# Patient Record
Sex: Female | Born: 1970 | Race: White | Hispanic: No | Marital: Single | Smoking: Current every day smoker
Health system: Southern US, Community
[De-identification: ages and names within clinical notes are randomized; demographics above are authoritative.]

## PROBLEM LIST (undated history)

## (undated) DIAGNOSIS — G8929 Other chronic pain: Secondary | ICD-10-CM

## (undated) DIAGNOSIS — F32A Depression, unspecified: Secondary | ICD-10-CM

## (undated) DIAGNOSIS — B1920 Unspecified viral hepatitis C without hepatic coma: Secondary | ICD-10-CM

## (undated) DIAGNOSIS — I639 Cerebral infarction, unspecified: Secondary | ICD-10-CM

## (undated) DIAGNOSIS — F329 Major depressive disorder, single episode, unspecified: Secondary | ICD-10-CM

## (undated) DIAGNOSIS — C801 Malignant (primary) neoplasm, unspecified: Secondary | ICD-10-CM

## (undated) DIAGNOSIS — F419 Anxiety disorder, unspecified: Secondary | ICD-10-CM

## (undated) DIAGNOSIS — J45909 Unspecified asthma, uncomplicated: Secondary | ICD-10-CM

## (undated) HISTORY — DX: Unspecified viral hepatitis C without hepatic coma: B19.20

## (undated) HISTORY — PX: FOOT SURGERY: SHX648

## (undated) HISTORY — PX: TUBAL LIGATION: SHX77

## (undated) HISTORY — DX: Unspecified asthma, uncomplicated: J45.909

## (undated) HISTORY — PX: KNEE SURGERY: SHX244

## (undated) HISTORY — PX: OVARY SURGERY: SHX727

## (undated) HISTORY — PX: CHOLECYSTECTOMY: SHX55

---

## 2003-03-27 ENCOUNTER — Ambulatory Visit (HOSPITAL_COMMUNITY): Admission: RE | Admit: 2003-03-27 | Discharge: 2003-03-27 | Payer: Self-pay | Admitting: Family Medicine

## 2003-11-07 ENCOUNTER — Ambulatory Visit (HOSPITAL_COMMUNITY): Admission: RE | Admit: 2003-11-07 | Discharge: 2003-11-07 | Payer: Self-pay | Admitting: Family Medicine

## 2003-11-22 ENCOUNTER — Ambulatory Visit (HOSPITAL_COMMUNITY): Admission: RE | Admit: 2003-11-22 | Discharge: 2003-11-22 | Payer: Self-pay | Admitting: Family Medicine

## 2003-11-23 ENCOUNTER — Ambulatory Visit (HOSPITAL_COMMUNITY): Admission: RE | Admit: 2003-11-23 | Discharge: 2003-11-23 | Payer: Self-pay | Admitting: Family Medicine

## 2003-12-03 ENCOUNTER — Ambulatory Visit (HOSPITAL_COMMUNITY): Admission: RE | Admit: 2003-12-03 | Discharge: 2003-12-03 | Payer: Self-pay | Admitting: Family Medicine

## 2003-12-20 ENCOUNTER — Encounter (HOSPITAL_COMMUNITY): Admission: RE | Admit: 2003-12-20 | Discharge: 2003-12-23 | Payer: Self-pay | Admitting: Family Medicine

## 2004-01-09 ENCOUNTER — Ambulatory Visit (HOSPITAL_COMMUNITY): Admission: RE | Admit: 2004-01-09 | Discharge: 2004-01-09 | Payer: Self-pay | Admitting: General Surgery

## 2004-01-14 ENCOUNTER — Observation Stay (HOSPITAL_COMMUNITY): Admission: RE | Admit: 2004-01-14 | Discharge: 2004-01-15 | Payer: Self-pay | Admitting: General Surgery

## 2004-02-07 ENCOUNTER — Emergency Department (HOSPITAL_COMMUNITY): Admission: EM | Admit: 2004-02-07 | Discharge: 2004-02-07 | Payer: Self-pay | Admitting: Emergency Medicine

## 2005-02-08 HISTORY — PX: CHOLECYSTECTOMY: SHX55

## 2005-05-29 IMAGING — NM NM HEPATO W/GB/PHARM/[PERSON_NAME]
2 series · 12 of 12 positions shown · non-contrast
Comparison: none

CLINICAL DATA: Right upper quadrant pain.
 NUCLEAR MEDICINE HEPATOBILIARY SCAN
 Hepatobiliary study was performed after the IV administration of 5.0 mCi 8c-AAK Choletec.  Images were performed at different intervals up to one hour.

[Series 1: hepatobiliary · 3.20mm/px · 6 of 60 frames shown (1 of 2)]
[frame 6/60]
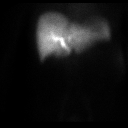
[frame 16/60]
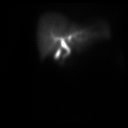
[frame 26/60]
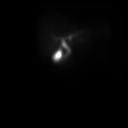
[frame 36/60]
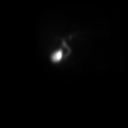
[frame 46/60]
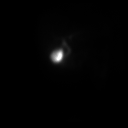
[frame 56/60]
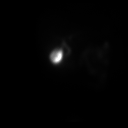

[Series 1: hepatobiliary · 3.20mm/px · 6 of 60 frames shown (2 of 2)]
[frame 6/60]
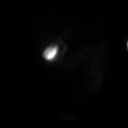
[frame 16/60]
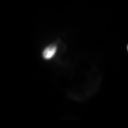
[frame 26/60]
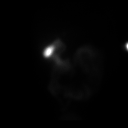
[frame 36/60]
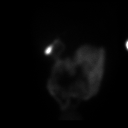
[frame 46/60]
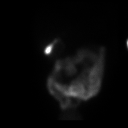
[frame 56/60]
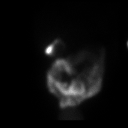

[12 of 12 positions shown; findings below may reference images not displayed]

The liver was seen promptly followed by the biliary ducts and then the gallbladder in the first 12 minutes of the study.  Intestinal activity was seen approximately by 17 minutes.  At the end of one hour, most of the activity had cleared from the liver and was concentrated in the gallbladder and small intestine.  Post fatty meal challenge, there was more passage of the tracer into the small bowel and the gallbladder emptied with an ejection fraction of 82% (normal greater than 35%).
IMPRESSION: Normal gallbladder ejection fraction of 82% with no evidence of cystic duct or common bile duct obstruction.

## 2005-06-01 ENCOUNTER — Ambulatory Visit: Payer: Self-pay | Admitting: Physical Medicine and Rehabilitation

## 2005-06-01 ENCOUNTER — Encounter
Admission: RE | Admit: 2005-06-01 | Discharge: 2005-08-30 | Payer: Self-pay | Admitting: Physical Medicine and Rehabilitation

## 2005-06-18 ENCOUNTER — Ambulatory Visit (HOSPITAL_COMMUNITY)
Admission: RE | Admit: 2005-06-18 | Discharge: 2005-06-18 | Payer: Self-pay | Admitting: Physical Medicine and Rehabilitation

## 2005-07-15 ENCOUNTER — Ambulatory Visit: Payer: Self-pay | Admitting: Physical Medicine and Rehabilitation

## 2006-06-10 ENCOUNTER — Ambulatory Visit (HOSPITAL_COMMUNITY): Admission: RE | Admit: 2006-06-10 | Discharge: 2006-06-10 | Payer: Self-pay | Admitting: Family Medicine

## 2006-11-23 ENCOUNTER — Ambulatory Visit (HOSPITAL_COMMUNITY): Admission: RE | Admit: 2006-11-23 | Discharge: 2006-11-23 | Payer: Self-pay | Admitting: Family Medicine

## 2007-05-31 ENCOUNTER — Emergency Department (HOSPITAL_COMMUNITY): Admission: EM | Admit: 2007-05-31 | Discharge: 2007-05-31 | Payer: Self-pay | Admitting: Emergency Medicine

## 2009-07-06 ENCOUNTER — Emergency Department (HOSPITAL_COMMUNITY): Admission: EM | Admit: 2009-07-06 | Discharge: 2009-07-06 | Payer: Self-pay | Admitting: Emergency Medicine

## 2010-03-01 ENCOUNTER — Encounter: Payer: Self-pay | Admitting: Physical Medicine and Rehabilitation

## 2010-03-01 ENCOUNTER — Encounter: Payer: Self-pay | Admitting: Family Medicine

## 2010-04-02 ENCOUNTER — Emergency Department (HOSPITAL_COMMUNITY)
Admission: EM | Admit: 2010-04-02 | Discharge: 2010-04-02 | Disposition: A | Discharge status: Home / Self Care | Payer: Medicare Other | Attending: Emergency Medicine | Admitting: Emergency Medicine

## 2010-04-02 DIAGNOSIS — M545 Low back pain, unspecified: Secondary | ICD-10-CM | POA: Insufficient documentation

## 2010-04-02 DIAGNOSIS — Z760 Encounter for issue of repeat prescription: Secondary | ICD-10-CM | POA: Insufficient documentation

## 2010-04-27 LAB — POCT PREGNANCY, URINE: Preg Test, Ur: NEGATIVE

## 2010-04-27 LAB — DIFFERENTIAL
Basophils Absolute: 0.2 10*3/uL — ABNORMAL HIGH (ref 0.0–0.1)
Basophils Relative: 2 % — ABNORMAL HIGH (ref 0–1)
Eosinophils Absolute: 0.1 10*3/uL (ref 0.0–0.7)
Eosinophils Relative: 1 % (ref 0–5)
Lymphocytes Relative: 26 % (ref 12–46)
Lymphs Abs: 3.5 10*3/uL (ref 0.7–4.0)
Monocytes Absolute: 0.8 10*3/uL (ref 0.1–1.0)
Monocytes Relative: 6 % (ref 3–12)
Neutro Abs: 8.8 10*3/uL — ABNORMAL HIGH (ref 1.7–7.7)
Neutrophils Relative %: 66 % (ref 43–77)

## 2010-04-27 LAB — POCT I-STAT, CHEM 8
BUN: 5 mg/dL — ABNORMAL LOW (ref 6–23)
Calcium, Ion: 1.15 mmol/L (ref 1.12–1.32)
Chloride: 105 mEq/L (ref 96–112)
Creatinine, Ser: 0.8 mg/dL (ref 0.4–1.2)
Glucose, Bld: 77 mg/dL (ref 70–99)
HCT: 45 % (ref 36.0–46.0)
Hemoglobin: 15.3 g/dL — ABNORMAL HIGH (ref 12.0–15.0)
Potassium: 3.7 mEq/L (ref 3.5–5.1)
Sodium: 139 mEq/L (ref 135–145)
TCO2: 26 mmol/L (ref 0–100)

## 2010-04-27 LAB — URINALYSIS, ROUTINE W REFLEX MICROSCOPIC
Bilirubin Urine: NEGATIVE
Glucose, UA: NEGATIVE mg/dL
Hgb urine dipstick: NEGATIVE
Ketones, ur: NEGATIVE mg/dL
Nitrite: NEGATIVE
Protein, ur: NEGATIVE mg/dL
Specific Gravity, Urine: 1.025 (ref 1.005–1.030)
Urobilinogen, UA: 0.2 mg/dL (ref 0.0–1.0)
pH: 5.5 (ref 5.0–8.0)

## 2010-04-27 LAB — CBC
HCT: 43.4 % (ref 36.0–46.0)
Hemoglobin: 14.9 g/dL (ref 12.0–15.0)
MCHC: 34.3 g/dL (ref 30.0–36.0)
MCV: 91.3 fL (ref 78.0–100.0)
Platelets: 213 10*3/uL (ref 150–400)
RBC: 4.75 MIL/uL (ref 3.87–5.11)
RDW: 13.2 % (ref 11.5–15.5)
WBC: 13.4 10*3/uL — ABNORMAL HIGH (ref 4.0–10.5)

## 2010-04-27 LAB — RPR: RPR Ser Ql: NONREACTIVE

## 2010-06-17 ENCOUNTER — Emergency Department (HOSPITAL_COMMUNITY)
Admission: EM | Admit: 2010-06-17 | Discharge: 2010-06-17 | Disposition: A | Discharge status: Home / Self Care | Payer: Medicare Other | Attending: Emergency Medicine | Admitting: Emergency Medicine

## 2010-06-17 DIAGNOSIS — Z760 Encounter for issue of repeat prescription: Secondary | ICD-10-CM | POA: Insufficient documentation

## 2010-06-17 DIAGNOSIS — F411 Generalized anxiety disorder: Secondary | ICD-10-CM | POA: Insufficient documentation

## 2010-06-17 DIAGNOSIS — F329 Major depressive disorder, single episode, unspecified: Secondary | ICD-10-CM | POA: Insufficient documentation

## 2010-06-17 DIAGNOSIS — Z79899 Other long term (current) drug therapy: Secondary | ICD-10-CM | POA: Insufficient documentation

## 2010-06-17 DIAGNOSIS — F3289 Other specified depressive episodes: Secondary | ICD-10-CM | POA: Insufficient documentation

## 2010-06-26 NOTE — Op Note (Signed)
Brooke Woods, [name]               ACCOUNT NO.:  [id number]   MEDICAL RECORD NO.:  [id number]          PATIENT TYPE:  OBV   LOCATION:  A306                          FACILITY:  [facility]   PHYSICIAN:  Dirk Dress. Katrinka Blazing, M.D.   DATE OF BIRTH:  [birth year]   DATE OF PROCEDURE:  01/14/2004  DATE OF DISCHARGE:                                 OPERATIVE REPORT   PREOPERATIVE DIAGNOSIS:  Chronic cholecystitis.   POSTOPERATIVE DIAGNOSIS:  Chronic cholecystitis.   PROCEDURE:  Laparoscopic cholecystectomy.   SURGEON:  Dr. Katrinka Blazing.   DESCRIPTION:  Under general endotracheal anesthesia, the patient's abdomen  was prepped and draped in sterile field. Supraumbilical incision was made.  Veress needle was inserted uneventfully. Abdomen was insufflated with 2.5  liters of CO2. Using a Visiport guide, a 10-mm port was placed. Laparoscope  was placed. Very distended and slightly thickened gallbladder was  visualized. Under videoscopic guidance, a 10-mm port and two 5-mm ports were  placed. The gallbladder was grasped and positioned. Cystic duct was  dissected. It was dissected close to the gallbladder. It was clipped with 5  clips and divided, leaving a long cystic duct stump. The cystic artery with  only 1 major branch noted. It was clipped 3 clips and divided. Using  electrocautery, gallbladder was separated from the intrahepatic space  without difficulty. The gallbladder was placed in an EndoCatch device and  retrieved intact. There was no bleeding from the bed. Irrigation above and  below the liver revealed no bleeding  and no bile leak. CO2 was allowed to  escape from the abdomen, and the ports were removed. The incisions were  closed using 0 Vicryl on the fascia of the supraumbilical incision and  staples on the skin incisions. The patient tolerated the procedure well. She  was awakened from anesthesia uneventfully, transferred to a bed, and taken  to the post anesthetic care unit for further  monitoring.     Lero   LCS/MEDQ  D:  01/14/2004  T:  01/14/2004  Job:  [id number]

## 2010-06-26 NOTE — H&P (Signed)
Brooke Woods, Brooke Woods               ACCOUNT NO.:  [id number]   MEDICAL RECORD NO.:  [id number]          PATIENT TYPE:  AMB   LOCATION:  DAY                           FACILITY:  [facility]   PHYSICIAN:  Jerolyn Shin C. Katrinka Blazing, M.D.   DATE OF BIRTH:  1970/06/23   DATE OF ADMISSION:  DATE OF DISCHARGE:  LH                                HISTORY & PHYSICAL   HISTORY OF PRESENT ILLNESS:  A 40 year old female with a history of  recurrent upper abdominal discomfort without nausea, with postprandial  bloating and enlarging abdomen.  She has not had postprandial diarrhea.  She  has spicy and greasy and fried food intolerance.  Upper abdominal ultrasound  was unremarkable, except for gallbladder sludge.  The HIDA scan is normal.   PAST MEDICAL HISTORY:  1.  Depression with anxiety.  2.  Chronic low back pain.  3.  Chronic bilateral foot pain.   MEDICATIONS:  1.  Prozac 20 mg b.i.d.  2.  Hydrochlorothiazide 12.5 mg daily.  3.  Xanax 1 mg t.i.d.  4.  Neurontin 300 mg b.i.d.  5.  Wellbutrin XL 150 mg daily.  6.  Percocet 10/325 one q.i.d.   PHYSICAL EXAMINATION:  VITAL SIGNS:  Blood pressure 110/70, pulse 76,  respirations 18, weight 137 pounds.  HEENT:  Unremarkable.  NECK:  Supple.  No JVD, bruit, adenopathy, or thyromegaly.  CHEST:  Clear to auscultation.  HEART:  Regular rate and rhythm without murmurs, gallops, or rubs.  ABDOMEN:  Epigastric and right upper quadrant tenderness.  Mild hypogastric  tenderness.  Normoactive bowel sounds.  No masses.  BACK:  Diffuse lower lumbar tenderness.  EXTREMITIES:  There is tenderness with mild swelling of the left knee.  NEUROLOGIC:  No motor, sensory, or cerebellar deficit.   IMPRESSION:  1.  Recurrent epigastric pain of uncertain etiology.  2.  Chronic low back pain.  3.  Chronic bilateral foot pain.  4.  Osteoarthritis.  5.  Depression with anxiety.   PLAN:  Upper endoscopy.  If upper endoscopy is entirely normal, it is felt  that she should have  cholecystectomy for chronic cholecystitis or biliary  dyskinesia.     Lero   LCS/MEDQ  D:  01/08/2004  T:  01/09/2004  Job:  [id number]
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
## 2010-06-28 ENCOUNTER — Emergency Department (HOSPITAL_COMMUNITY)
Admission: EM | Admit: 2010-06-28 | Discharge: 2010-06-28 | Disposition: A | Discharge status: Home / Self Care | Payer: Medicare Other | Attending: Emergency Medicine | Admitting: Emergency Medicine

## 2010-06-28 DIAGNOSIS — F411 Generalized anxiety disorder: Secondary | ICD-10-CM | POA: Insufficient documentation

## 2010-06-28 DIAGNOSIS — F329 Major depressive disorder, single episode, unspecified: Secondary | ICD-10-CM | POA: Insufficient documentation

## 2010-06-28 DIAGNOSIS — Z760 Encounter for issue of repeat prescription: Secondary | ICD-10-CM | POA: Insufficient documentation

## 2010-06-28 DIAGNOSIS — G8929 Other chronic pain: Secondary | ICD-10-CM | POA: Insufficient documentation

## 2010-06-28 DIAGNOSIS — F3289 Other specified depressive episodes: Secondary | ICD-10-CM | POA: Insufficient documentation

## 2010-06-28 DIAGNOSIS — Z79899 Other long term (current) drug therapy: Secondary | ICD-10-CM | POA: Insufficient documentation

## 2010-08-13 ENCOUNTER — Other Ambulatory Visit (HOSPITAL_COMMUNITY): Payer: Medicare Other

## 2010-08-14 ENCOUNTER — Telehealth (HOSPITAL_COMMUNITY): Payer: Self-pay | Admitting: Neurology

## 2010-08-14 NOTE — Telephone Encounter (Signed)
Pt. Was a NOS 08/13/2010 call to [facility] voice mail picked up unable to leave message

## 2010-08-17 ENCOUNTER — Emergency Department (HOSPITAL_COMMUNITY)
Admission: EM | Admit: 2010-08-17 | Discharge: 2010-08-17 | Disposition: A | Discharge status: Home / Self Care | Payer: Medicare Other | Attending: Emergency Medicine | Admitting: Emergency Medicine

## 2010-08-17 ENCOUNTER — Other Ambulatory Visit: Payer: Self-pay

## 2010-08-17 DIAGNOSIS — G8929 Other chronic pain: Secondary | ICD-10-CM | POA: Insufficient documentation

## 2010-08-17 DIAGNOSIS — F17200 Nicotine dependence, unspecified, uncomplicated: Secondary | ICD-10-CM | POA: Insufficient documentation

## 2010-08-17 DIAGNOSIS — F411 Generalized anxiety disorder: Secondary | ICD-10-CM | POA: Diagnosis present

## 2010-08-17 DIAGNOSIS — Z8543 Personal history of malignant neoplasm of ovary: Secondary | ICD-10-CM | POA: Insufficient documentation

## 2010-08-17 HISTORY — DX: Other chronic pain: G89.29

## 2010-08-17 HISTORY — DX: Anxiety disorder, unspecified: F41.9

## 2010-08-17 HISTORY — DX: Malignant (primary) neoplasm, unspecified: C80.1

## 2010-08-17 HISTORY — DX: Cerebral infarction, unspecified: I63.9

## 2010-08-17 HISTORY — DX: Major depressive disorder, single episode, unspecified: F32.9

## 2010-08-17 HISTORY — DX: Depression, unspecified: F32.A

## 2010-08-17 MED ORDER — LORAZEPAM 1 MG PO TABS: 1.0000 mg | ORAL_TABLET | Freq: Two times a day (BID) | ORAL | Status: AC

## 2010-08-17 NOTE — ED Notes (Signed)
Pt presents with c/o of being out of medication. Pt states she cant sleep, she feel nervous, sweaty hands, and chest tightness. Pt has been out of medications x 3 days. Pt is out of Cymbalta, Xanax, Ambien, and Oxycodone.

## 2010-08-17 NOTE — ED Notes (Signed)
Patient reports having an appointment for [facility] on August 22.

## 2010-08-17 NOTE — ED Provider Notes (Signed)
History     Chief Complaint  Patient presents with  . Medication Refill   HPI Comments: Pt reports she has been out of her usual meds for 3 days (xanx, cymbalta, ambien, and percocet) and is feeling very anxious and having panic attacks. +chest tightness with panic attacks but no cp/sob. No sx currently. Also c/o usual chronic pain that is unchanged from baseline. Pt is requesting referral to pain management clinic. Denies n/v/d, f/c, abd pain, or ha.  Patient is a 40 y.o. female presenting with anxiety.  Anxiety This is a chronic problem. The current episode started 2 days ago. Episode frequency: intermittently. The problem has been gradually worsening. Pertinent negatives include no chest pain, no abdominal pain, no headaches and no shortness of breath. The symptoms are aggravated by nothing. The symptoms are relieved by medications.    Past Medical History  Diagnosis Date  . Depression   . Anxiety   . Chronic pain   . Cancer     Pt states she has had ovarian cancer  . Stroke     Pt reported TIA 04/27/10    Past Surgical History  Procedure Date  . Ovary surgery   . Foot surgery   . Knee surgery   . Cholecystectomy     History reviewed. No pertinent family history.  History  Substance Use Topics  . Smoking status: Current Everyday Smoker -- 1.0 packs/day  . Smokeless tobacco: Not on file  . Alcohol Use: No    OB History    Grav Para Term Preterm Abortions TAB SAB Ect Mult Living                  Review of Systems  Respiratory: Negative for shortness of breath.   Cardiovascular: Negative for chest pain.  Gastrointestinal: Negative for abdominal pain.  Neurological: Negative for headaches.  Psychiatric/Behavioral: The patient is nervous/anxious.   All other systems reviewed and are negative.    Physical Exam  BP 126/83  Pulse 76  Temp(Src) 98.3 F (36.8 C) (Oral)  Resp 20  Ht 5\' 7"  (1.702 m)  Wt 110 lb (49.896 kg)  BMI 17.23 kg/m2  SpO2 97%  LMP  08/10/2010  Physical Exam CONSTITUTIONAL: Well developed/well nourished; slightly anxious HEAD AND FACE: Normocephalic/atraumatic EYES: EOMI/PERRL ENMT: Mucous membranes moist NECK: supple no meningeal signs CV: S1/S2 noted, no murmurs/rubs/gallops noted LUNGS: Lungs are clear to auscultation bilaterally, no apparent distress ABDOMEN: soft, nontender, no rebound or guarding GU:no cva tenderness NEURO: Pt is awake/alert, moves all extremitiesx4 EXTREMITIES: pulses normal, full ROM SKIN: warm, color normal PSYCH: slightly anxious  ED Course  Procedures Written by Enos Fling acting as scribe for Dr. Bebe Shaggy.   MDM Nursing notes reviewed and considered in documentation Pt admits her pain is chronic Mildly anxious but otherwise stable Will give short course of ativan and advise outpatient followup I advised that I can not refill her chronic pain meds  EKG: normal sinus rhythm, nonspecific ST and T waves changes, nonspecific intraventricular conduction delay, normal axis.   I personally performed the services described in this documentation, which was scribed in my presence. The recorded information has been reviewed and considered.   Joya Gaskins, MD 08/17/10 1515

## 2010-08-23 ENCOUNTER — Emergency Department (HOSPITAL_COMMUNITY)
Admission: EM | Admit: 2010-08-23 | Discharge: 2010-08-23 | Disposition: A | Discharge status: Home / Self Care | Payer: Medicare Other | Attending: Emergency Medicine | Admitting: Emergency Medicine

## 2010-08-23 ENCOUNTER — Encounter (HOSPITAL_COMMUNITY): Payer: Self-pay

## 2010-08-23 DIAGNOSIS — Z8543 Personal history of malignant neoplasm of ovary: Secondary | ICD-10-CM | POA: Insufficient documentation

## 2010-08-23 DIAGNOSIS — Z760 Encounter for issue of repeat prescription: Secondary | ICD-10-CM | POA: Insufficient documentation

## 2010-08-23 DIAGNOSIS — G8929 Other chronic pain: Secondary | ICD-10-CM | POA: Insufficient documentation

## 2010-08-23 DIAGNOSIS — F17200 Nicotine dependence, unspecified, uncomplicated: Secondary | ICD-10-CM | POA: Insufficient documentation

## 2010-08-23 DIAGNOSIS — Z8673 Personal history of transient ischemic attack (TIA), and cerebral infarction without residual deficits: Secondary | ICD-10-CM | POA: Insufficient documentation

## 2010-08-23 DIAGNOSIS — F341 Dysthymic disorder: Secondary | ICD-10-CM | POA: Insufficient documentation

## 2010-08-23 MED ORDER — ALPRAZOLAM 0.5 MG PO TABS
1.0000 mg | ORAL_TABLET | Freq: Once | ORAL | Status: AC
  Administered 2010-08-23: 1 mg via ORAL
  Filled 2010-08-23: qty 2

## 2010-08-23 MED ORDER — ALPRAZOLAM 1 MG PO TABS: 1.0000 mg | ORAL_TABLET | Freq: Two times a day (BID) | ORAL | Status: AC

## 2010-08-23 NOTE — ED Notes (Signed)
Pt was thankful and pleasant for her medication administration.  No adverse effect noted.

## 2010-08-23 NOTE — ED Notes (Signed)
Patient denies pain and is resting comfortably.  

## 2010-08-23 NOTE — ED Notes (Signed)
Pt has multiple c/o anxiety/chronic pain and 'wants/needs" RX refills. Has appt at [facility] in august. Unsure of the day.

## 2010-08-23 NOTE — ED Notes (Signed)
Pt presents with request for medication refill. Pt out of xanax, ambien, cymbalta, oxycodone. Pt has appt with [facility] in August but returns to ED for refill. NAD at this time. Pt states" Ive been on these meds for 12 yrs and I cant just stop".

## 2010-08-27 NOTE — ED Provider Notes (Addendum)
History     Chief Complaint  Patient presents with  . Medication Refill   HPI Comments: Patient presents for assistance with medication refill.  She has run out of all of her medications,  Including her xanax,  Cymbalta,  ambien and oxycodone.  She is scheduled to establish care with a provider at mental health next week but is concerned about withdrawing from her medicines,  Particularly xanax,  Which she reports has been on for years.  She does have increased anxiety,  But denies tremor,  Nausea,  Fevers.    Patient is a 40 y.o. female presenting with anxiety. The history is provided by the patient.  Anxiety This is a recurrent problem. Pertinent negatives include no abdominal pain, arthralgias, chest pain, chills, congestion, diaphoresis, fever, headaches, joint swelling, nausea, neck pain, numbness, rash, sore throat, vomiting or weakness. The symptoms are aggravated by nothing. She has tried nothing for the symptoms.  Anxiety This is a recurrent problem. Pertinent negatives include no chest pain, no abdominal pain, no headaches and no shortness of breath. The symptoms are aggravated by nothing. She has tried nothing for the symptoms.    Past Medical History  Diagnosis Date  . Depression   . Anxiety   . Chronic pain   . Cancer     Pt states she has had ovarian cancer  . Stroke     Pt reported TIA 04/27/10    Past Surgical History  Procedure Date  . Ovary surgery   . Foot surgery   . Knee surgery   . Cholecystectomy     History reviewed. No pertinent family history.  History  Substance Use Topics  . Smoking status: Current Everyday Smoker -- 1.0 packs/day  . Smokeless tobacco: Not on file  . Alcohol Use: No    OB History    Grav Para Term Preterm Abortions TAB SAB Ect Mult Living                  Review of Systems  Constitutional: Negative for fever, chills and diaphoresis.  HENT: Negative for congestion, sore throat and neck pain.   Eyes: Negative.     Respiratory: Negative for chest tightness and shortness of breath.   Cardiovascular: Negative for chest pain.  Gastrointestinal: Negative for nausea, vomiting and abdominal pain.  Genitourinary: Negative.   Musculoskeletal: Negative for joint swelling and arthralgias.  Skin: Negative.  Negative for rash and wound.  Neurological: Negative for dizziness, weakness, light-headedness, numbness and headaches.  Hematological: Negative.   Psychiatric/Behavioral: Positive for sleep disturbance. Negative for hallucinations, confusion and agitation.    Physical Exam  BP 110/80  Pulse 90  Temp(Src) 98.7 F (37.1 C) (Oral)  Resp 20  Ht 5\' 7"  (1.702 m)  Wt 107 lb (48.535 kg)  BMI 16.76 kg/m2  SpO2 100%  LMP 08/10/2010  Physical Exam  Vitals reviewed. Constitutional: She is oriented to person, place, and time. She appears well-developed and well-nourished.  HENT:  Head: Normocephalic and atraumatic.  Eyes: Conjunctivae are normal.  Neck: Normal range of motion.  Cardiovascular: Normal rate, regular rhythm, normal heart sounds and intact distal pulses.   Pulmonary/Chest: Effort normal and breath sounds normal. She has no wheezes.  Abdominal: Soft. Bowel sounds are normal. There is no tenderness.  Musculoskeletal: Normal range of motion.  Neurological: She is alert and oriented to person, place, and time.  Skin: Skin is warm and dry.  Psychiatric: Her speech is normal and behavior is normal. Judgment and thought  content normal. Her mood appears anxious. Cognition and memory are normal.    ED Course  Procedures  MDM       Candis Musa, PA 08/27/10 2217  Evaluation and management procedures were performed by the PA/NP under my supervision/collaboration.    Felisa Bonier, MD 12/05/10 [phone]

## 2010-10-09 NOTE — ED Provider Notes (Signed)
Medical screening examination/treatment/procedure(s) were performed by non-physician practitioner and as supervising physician I was immediately available for consultation/collaboration.  Umeka Wrench M Nazeer Romney, MD 10/09/10 2114 

## 2011-10-01 ENCOUNTER — Ambulatory Visit: Payer: Medicare Other | Admitting: Family Medicine

## 2011-10-05 ENCOUNTER — Encounter: Payer: Self-pay | Admitting: Family Medicine

## 2011-10-05 ENCOUNTER — Ambulatory Visit (INDEPENDENT_AMBULATORY_CARE_PROVIDER_SITE_OTHER): Payer: Medicare Other | Admitting: Family Medicine

## 2011-10-05 VITALS — BP 106/64 | HR 58 | Resp 18 | Ht 66.0 in | Wt 110.1 lb

## 2011-10-05 DIAGNOSIS — R634 Abnormal weight loss: Secondary | ICD-10-CM

## 2011-10-05 DIAGNOSIS — F3289 Other specified depressive episodes: Secondary | ICD-10-CM

## 2011-10-05 DIAGNOSIS — F17200 Nicotine dependence, unspecified, uncomplicated: Secondary | ICD-10-CM

## 2011-10-05 DIAGNOSIS — Z720 Tobacco use: Secondary | ICD-10-CM

## 2011-10-05 DIAGNOSIS — I10 Essential (primary) hypertension: Secondary | ICD-10-CM | POA: Insufficient documentation

## 2011-10-05 DIAGNOSIS — Z1239 Encounter for other screening for malignant neoplasm of breast: Secondary | ICD-10-CM

## 2011-10-05 DIAGNOSIS — F329 Major depressive disorder, single episode, unspecified: Secondary | ICD-10-CM

## 2011-10-05 DIAGNOSIS — K089 Disorder of teeth and supporting structures, unspecified: Secondary | ICD-10-CM

## 2011-10-05 DIAGNOSIS — E785 Hyperlipidemia, unspecified: Secondary | ICD-10-CM | POA: Insufficient documentation

## 2011-10-05 DIAGNOSIS — F32A Depression, unspecified: Secondary | ICD-10-CM

## 2011-10-05 DIAGNOSIS — F411 Generalized anxiety disorder: Secondary | ICD-10-CM

## 2011-10-05 DIAGNOSIS — G8929 Other chronic pain: Secondary | ICD-10-CM

## 2011-10-05 LAB — CBC WITH DIFFERENTIAL/PLATELET
Eosinophils Absolute: 0.1 10*3/uL (ref 0.0–0.7)
Eosinophils Relative: 1 % (ref 0–5)
HCT: 42.1 % (ref 36.0–46.0)
Hemoglobin: 14.7 g/dL (ref 12.0–15.0)
Lymphs Abs: 2.3 10*3/uL (ref 0.7–4.0)
MCH: 30.7 pg (ref 26.0–34.0)
MCV: 87.9 fL (ref 78.0–100.0)
Monocytes Absolute: 0.4 10*3/uL (ref 0.1–1.0)
Monocytes Relative: 7 % (ref 3–12)
Neutrophils Relative %: 53 % (ref 43–77)
RBC: 4.79 MIL/uL (ref 3.87–5.11)

## 2011-10-05 LAB — TSH: TSH: 1.781 u[IU]/mL (ref 0.350–4.500)

## 2011-10-05 LAB — COMPREHENSIVE METABOLIC PANEL
AST: 14 U/L (ref 0–37)
Alkaline Phosphatase: 84 U/L (ref 39–117)
BUN: 9 mg/dL (ref 6–23)
Creat: 0.79 mg/dL (ref 0.50–1.10)
Glucose, Bld: 90 mg/dL (ref 70–99)
Total Bilirubin: 0.3 mg/dL (ref 0.3–1.2)

## 2011-10-05 LAB — LIPID PANEL
HDL: 40 mg/dL (ref >39)
Total CHOL/HDL Ratio: 5.7 Ratio
VLDL: 24 mg/dL (ref 0–40)

## 2011-10-05 NOTE — Assessment & Plan Note (Signed)
Continue with mental health.

## 2011-10-05 NOTE — Assessment & Plan Note (Signed)
I will start with baseline labs and she is a smoker however I think her weight loss is due to this stimulant effect with her ADHD medication

## 2011-10-05 NOTE — Assessment & Plan Note (Signed)
Treatment per mental health

## 2011-10-05 NOTE — Assessment & Plan Note (Signed)
Recheck cholesterol panel and see if patient needs her previous statin, she has not taken in over a year

## 2011-10-05 NOTE — Patient Instructions (Addendum)
Get the blood work done fasting - we will call with results Continue your current medications Mammogram to be scheduled in [place]  Work on the smoking!  F/U  4 months

## 2011-10-05 NOTE — Assessment & Plan Note (Signed)
She was previously on antihypertensive medication. When she started with chronic narcotics she noted that her blood pressure decreased therefore she stopped her antihypertensives over a year ago. Blood pressure looks fine off of medications

## 2011-10-05 NOTE — Assessment & Plan Note (Signed)
I will send a letter in reference to her need for her bridge.

## 2011-10-05 NOTE — Progress Notes (Signed)
  Subjective:    Patient ID: Brooke Woods, female    DOB: Jul 07, 1970, 41 y.o.   MRN: [id number]  HPI Patient here to establish care. Previous PCP Dr. Janna Arch. She is followed by pain management Dr. Laurian Brim. [facility]. GYN- [facility] in [place] not been seen by her primary in greater than 2 years. She has complaints of feeling fatigued jittery which improves with taking in sugary intake. She has these spells on and off. She has a history of ADHD and was placed on by a day at 4 months ago she has lost 14 pounds since then. She has a history of chronic pain and is on disability for this. She has chronic back pain which she's had epidural injections for as well as chronic foot pain from bilateral multiple surgeries and chronic knee pain from an injury as a child.  She is asking for a letter today to have her bridge repaired by [facility] She has 2 children Due for Mammogram Review of Systems  GEN- denies fatigue, fever, weight loss,weakness, recent illness HEENT- denies eye drainage, change in vision, nasal discharge, CVS- denies chest pain, palpitations RESP- denies SOB, cough, wheeze ABD- denies N/V, change in stools, abd pain GU- denies dysuria, hematuria, dribbling, incontinence MSK- + joint pain, muscle aches, injury Neuro- denies headache, dizziness, syncope, seizure activity      Objective:   Physical Exam GEN- NAD, alert and oriented x3, thin HEENT- PERRL, EOMI, non injected sclera, pink conjunctiva, MMM, oropharynx clear, poor dentition, missing bridge Neck- Supple, no thryomegaly CVS- RRR, no murmur RESP-CTAB ABD-NABS,soft,NT,ND EXT- No edema Pulses- Radial, DP- 2+ Psych-normal affect and Mood       Assessment & Plan:

## 2011-10-05 NOTE — Assessment & Plan Note (Signed)
She will continue with pain management.

## 2011-10-06 MED ORDER — SIMVASTATIN 10 MG PO TABS: 10.0000 mg | ORAL_TABLET | Freq: Every evening | ORAL | Status: DC

## 2011-10-06 NOTE — Addendum Note (Signed)
Addended by: Milinda Antis F on: 10/06/2011 08:09 AM   Modules accepted: Orders

## 2011-10-18 ENCOUNTER — Telehealth: Payer: Self-pay | Admitting: Family Medicine

## 2011-10-20 ENCOUNTER — Other Ambulatory Visit: Payer: Self-pay

## 2011-10-20 MED ORDER — SIMVASTATIN 10 MG PO TABS: 10.0000 mg | ORAL_TABLET | Freq: Every evening | ORAL | Status: DC

## 2011-10-20 NOTE — Telephone Encounter (Signed)
Med refilled to requested pharmacy  

## 2012-04-11 ENCOUNTER — Ambulatory Visit: Payer: Medicare Other | Admitting: Family Medicine

## 2012-04-17 ENCOUNTER — Ambulatory Visit: Payer: Medicare Other | Admitting: Family Medicine

## 2012-04-20 ENCOUNTER — Ambulatory Visit: Payer: Medicare Other | Admitting: Family Medicine

## 2012-04-21 ENCOUNTER — Encounter: Payer: Self-pay | Admitting: Family Medicine

## 2012-04-21 ENCOUNTER — Ambulatory Visit (INDEPENDENT_AMBULATORY_CARE_PROVIDER_SITE_OTHER): Payer: Medicare Other | Admitting: Family Medicine

## 2012-04-21 VITALS — BP 124/90 | HR 62 | Resp 16 | Wt 112.0 lb

## 2012-04-21 DIAGNOSIS — G8929 Other chronic pain: Secondary | ICD-10-CM

## 2012-04-21 DIAGNOSIS — M5136 Other intervertebral disc degeneration, lumbar region: Secondary | ICD-10-CM

## 2012-04-21 DIAGNOSIS — E785 Hyperlipidemia, unspecified: Secondary | ICD-10-CM

## 2012-04-21 DIAGNOSIS — M5137 Other intervertebral disc degeneration, lumbosacral region: Secondary | ICD-10-CM

## 2012-04-21 DIAGNOSIS — M5126 Other intervertebral disc displacement, lumbar region: Secondary | ICD-10-CM

## 2012-04-21 DIAGNOSIS — F411 Generalized anxiety disorder: Secondary | ICD-10-CM

## 2012-04-21 DIAGNOSIS — I10 Essential (primary) hypertension: Secondary | ICD-10-CM

## 2012-04-21 MED ORDER — TRAMADOL HCL 50 MG PO TABS: 50.0000 mg | ORAL_TABLET | Freq: Four times a day (QID) | ORAL | Status: DC | PRN
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
## 2012-04-21 NOTE — Patient Instructions (Addendum)
Referral to pain clinic  Tramadol as needed for pain Continue all other meds Get the cholesterol done today F/U 6 months

## 2012-04-22 NOTE — Assessment & Plan Note (Signed)
Well controlled no meds

## 2012-04-22 NOTE — Assessment & Plan Note (Signed)
On zocor, repeat Lipid panel needed

## 2012-04-22 NOTE — Assessment & Plan Note (Signed)
Unfortunately she failed UDS, she understands this was a violation Advised I will not giver her any narcotic medication We will try for another pain clinic in [place] but no guarantee Given short supply of ultram, she states this will not help

## 2012-04-22 NOTE — Assessment & Plan Note (Signed)
She is being tapered off her benzo's it appears, she has stopped some of her psych meds since previous visit, she did alert psychiatrist per  Report, advised her I will not increase her dose

## 2012-05-02 ENCOUNTER — Encounter (HOSPITAL_COMMUNITY): Payer: Self-pay | Admitting: *Deleted

## 2012-05-02 ENCOUNTER — Emergency Department (HOSPITAL_COMMUNITY)
Admission: EM | Admit: 2012-05-02 | Discharge: 2012-05-02 | Disposition: A | Discharge status: Home / Self Care | Payer: PRIVATE HEALTH INSURANCE | Attending: Emergency Medicine | Admitting: Emergency Medicine

## 2012-05-02 DIAGNOSIS — R450 Nervousness: Secondary | ICD-10-CM | POA: Insufficient documentation

## 2012-05-02 DIAGNOSIS — F3289 Other specified depressive episodes: Secondary | ICD-10-CM | POA: Insufficient documentation

## 2012-05-02 DIAGNOSIS — Z79899 Other long term (current) drug therapy: Secondary | ICD-10-CM | POA: Insufficient documentation

## 2012-05-02 DIAGNOSIS — Z8541 Personal history of malignant neoplasm of cervix uteri: Secondary | ICD-10-CM | POA: Insufficient documentation

## 2012-05-02 DIAGNOSIS — M545 Low back pain, unspecified: Secondary | ICD-10-CM | POA: Insufficient documentation

## 2012-05-02 DIAGNOSIS — J069 Acute upper respiratory infection, unspecified: Secondary | ICD-10-CM | POA: Insufficient documentation

## 2012-05-02 DIAGNOSIS — Z8673 Personal history of transient ischemic attack (TIA), and cerebral infarction without residual deficits: Secondary | ICD-10-CM | POA: Insufficient documentation

## 2012-05-02 DIAGNOSIS — F17200 Nicotine dependence, unspecified, uncomplicated: Secondary | ICD-10-CM | POA: Insufficient documentation

## 2012-05-02 DIAGNOSIS — F411 Generalized anxiety disorder: Secondary | ICD-10-CM | POA: Insufficient documentation

## 2012-05-02 DIAGNOSIS — G8929 Other chronic pain: Secondary | ICD-10-CM | POA: Insufficient documentation

## 2012-05-02 DIAGNOSIS — R52 Pain, unspecified: Secondary | ICD-10-CM | POA: Insufficient documentation

## 2012-05-02 DIAGNOSIS — F329 Major depressive disorder, single episode, unspecified: Secondary | ICD-10-CM | POA: Insufficient documentation

## 2012-05-02 DIAGNOSIS — M549 Dorsalgia, unspecified: Secondary | ICD-10-CM

## 2012-05-02 MED ORDER — HYDROCODONE-ACETAMINOPHEN 5-325 MG PO TABS: ORAL_TABLET | ORAL | Status: DC

## 2012-05-02 MED ORDER — CYCLOBENZAPRINE HCL 10 MG PO TABS
10.0000 mg | ORAL_TABLET | Freq: Once | ORAL | Status: AC
  Administered 2012-05-02: 10 mg via ORAL
  Filled 2012-05-02: qty 1

## 2012-05-02 MED ORDER — OXYCODONE-ACETAMINOPHEN 5-325 MG PO TABS
1.0000 | ORAL_TABLET | Freq: Once | ORAL | Status: AC
  Administered 2012-05-02: 1 via ORAL
  Filled 2012-05-02: qty 1

## 2012-05-02 MED ORDER — CYCLOBENZAPRINE HCL 10 MG PO TABS: 10.0000 mg | ORAL_TABLET | Freq: Three times a day (TID) | ORAL | Status: DC | PRN

## 2012-05-02 NOTE — ED Notes (Addendum)
Cough, body aches ,  Pt goes to [facility] and recently had change in medication. And feels these sx are due to this change. Had gone to pain clinic , but used marijuana and was released from program

## 2012-05-02 NOTE — ED Provider Notes (Signed)
History     CSN: [id number]  Arrival date & time 05/02/12  1439   First MD Initiated Contact with Patient 05/02/12 1458      Chief Complaint  Patient presents with  . Cough    (Consider location/radiation/quality/duration/timing/severity/associated sxs/prior treatment) HPI Comments: Patient c/o generalized body aches, cough and low back pain for several days.  States the cough is non-productive.  She denies fever, vomiting, hemoptysis or shortness of breath.  She also c/o increasing lower back pain.  States she was being seen by Dr. Laurian Brim, pain management in [place] for her chronic pain issues and states that he has discharged her from his practice due to testing positive for marijuana use.  She has contacted her PMD , Dr. Jeanice Lim, and states they are arranging a referral to another pain management facility. She denies recent injury, dsyuria, saddle anesthesia's or LE numbness or weakness.  She also states that [facility] has recently changed the doses on some of her medications and she believes that her sx's may be related.  She denies SI or HI thoughts.    Patient is a 42 y.o. female presenting with URI and back pain. The history is provided by the patient.  URI Presenting symptoms: cough   Presenting symptoms: no congestion, no facial pain, no fatigue, no fever, no rhinorrhea and no sore throat   Presenting symptoms comment:  Generalized body aches Severity:  Mild Onset quality:  Gradual Timing:  Constant Progression:  Worsening Chronicity:  New Relieved by:  Nothing Worsened by:  Nothing tried Ineffective treatments:  None tried Associated symptoms: myalgias   Associated symptoms: no headaches, no neck pain, no sneezing, no swollen glands and no wheezing   Risk factors: no diabetes mellitus and no sick contacts   Back Pain Location:  Lumbar spine Quality:  Aching Radiates to:  Does not radiate Pain severity:  Moderate Pain is:  Same all the time Onset quality:  Unable to  specify Timing:  Constant Progression:  Waxing and waning Chronicity:  Chronic Context: emotional stress and physical stress   Context: not falling and not recent illness   Relieved by:  Nothing Worsened by:  Nothing tried Ineffective treatments:  Ibuprofen Associated symptoms: no abdominal pain, no abdominal swelling, no bladder incontinence, no bowel incontinence, no chest pain, no dysuria, no fever, no headaches, no leg pain, no numbness, no paresthesias, no pelvic pain, no perianal numbness, no tingling and no weakness     Past Medical History  Diagnosis Date  . Depression   . Anxiety   . Chronic pain   . Stroke     Pt reported TIA 04/27/10  . Cancer     Pt states she has had cervical cancer    Past Surgical History  Procedure Laterality Date  . Ovary surgery    . Foot surgery    . Knee surgery    . Cholecystectomy    . Tubal ligation      Family History  Problem Relation Age of Onset  . Hypertension Mother   . Hyperlipidemia Mother   . Depression Mother   . Cancer Father   . Hypertension Father   . Hyperlipidemia Father   . Hyperlipidemia Sister   . Hyperlipidemia Brother   . Depression Brother     History  Substance Use Topics  . Smoking status: Current Every Day Smoker -- 1.00 packs/day    Types: Cigarettes  . Smokeless tobacco: Not on file  . Alcohol Use: No  OB History   Grav Para Term Preterm Abortions TAB SAB Ect Mult Living                  Review of Systems  Constitutional: Negative for fever, activity change, appetite change and fatigue.  HENT: Negative for congestion, sore throat, rhinorrhea, sneezing and neck pain.   Respiratory: Positive for cough. Negative for chest tightness, shortness of breath and wheezing.   Cardiovascular: Negative for chest pain.  Gastrointestinal: Negative for vomiting, abdominal pain, constipation and bowel incontinence.  Genitourinary: Negative for bladder incontinence, dysuria, hematuria, flank pain,  decreased urine volume, difficulty urinating and pelvic pain.       No perineal numbness or incontinence of urine or feces  Musculoskeletal: Positive for myalgias and back pain. Negative for joint swelling.  Skin: Negative for rash.  Neurological: Negative for dizziness, tingling, weakness, numbness, headaches and paresthesias.  Psychiatric/Behavioral: Negative for suicidal ideas and confusion. The patient is nervous/anxious.   All other systems reviewed and are negative.    Allergies  Review of patient's allergies indicates no known allergies.  Home Medications   Current Outpatient Rx  Name  Route  Sig  Dispense  Refill  . ALPRAZolam (XANAX) 0.5 MG tablet   Oral   Take 0.5 mg by mouth 3 (three) times daily as needed for sleep.         . baclofen (LIORESAL) 10 MG tablet   Oral   Take 10 mg by mouth 3 (three) times daily.         . busPIRone (BUSPAR) 5 MG tablet               . DULoxetine (CYMBALTA) 60 MG capsule   Oral   Take 60 mg by mouth daily.           . Oxycodone HCl 10 MG TABS               . simvastatin (ZOCOR) 10 MG tablet   Oral   Take 1 tablet (10 mg total) by mouth every evening.   30 tablet   3   . traMADol (ULTRAM) 50 MG tablet   Oral   Take 1 tablet (50 mg total) by mouth every 6 (six) hours as needed for pain.   60 tablet   1   . traZODone (DESYREL) 100 MG tablet   Oral   Take 100 mg by mouth at bedtime. Take 1-2 tablets at bedtime           BP 135/88  Pulse 114  Temp(Src) 98.3 F (36.8 C) (Oral)  Resp 20  SpO2 96%  LMP 04/19/2012  Physical Exam  Nursing note and vitals reviewed. Constitutional: She is oriented to person, place, and time. She appears well-developed and well-nourished. No distress.  HENT:  Head: Normocephalic and atraumatic.  Mouth/Throat: Oropharynx is clear and moist.  Neck: Normal range of motion. Neck supple.  Cardiovascular: Normal rate, regular rhythm, normal heart sounds and intact distal pulses.    No murmur heard. Pulmonary/Chest: Effort normal and breath sounds normal. No respiratory distress. She has no wheezes. She has no rales. She exhibits no tenderness.  Coarse lung sounds bilaterally that improve after cough  Musculoskeletal: She exhibits tenderness. She exhibits no edema.       Lumbar back: She exhibits tenderness and pain. She exhibits normal range of motion, no bony tenderness, no swelling, no deformity, no laceration and normal pulse.       Back:  ttp of the  lumbar paraspinal muscles and bilateral SI joints.  Distal sensation intact, DP pulses are brisk and symmetrical  Neurological: She is alert and oriented to person, place, and time. No cranial nerve deficit or sensory deficit. She exhibits normal muscle tone. Coordination and gait normal.  Reflex Scores:      Patellar reflexes are 2+ on the right side and 2+ on the left side.      Achilles reflexes are 2+ on the right side and 2+ on the left side. Skin: Skin is warm and dry.    ED Course  Procedures (including critical care time)  Labs Reviewed - No data to display No results found.      MDM    ttp of the lumbar paraspinal muscles.  Pain is reproduced with SLR bilaterally.  No focal neuro deficits.  Patient ambulates with a slow but steady gait.  Doubt emergent neurological or infectious process.    Patient is waiting on referral info from her PMD to another pain management clinic in [place].   Patient was reviewed on the [place] narcotics database.  Last narcotic prescription was 04/05/12  Prescribed flexeril and norco #12  I have advised pt that she will need further management of her chronic pain with her PMD or another pain management center.  Appears stable for discharge.     The patient appears reasonably screened and/or stabilized for discharge and I doubt any other medical condition or other [facility] requiring further screening, evaluation, or treatment in the ED at this time prior to discharge.         Christerpher Clos L. Trisha Mangle, PA-C 05/04/12 1534

## 2012-05-04 NOTE — ED Provider Notes (Signed)
Medical screening examination/treatment/procedure(s) were performed by non-physician practitioner and as supervising physician I was immediately available for consultation/collaboration. Devoria Albe, MD, Armando Gang   Ward Givens, MD 05/04/12 2020

## 2012-05-15 ENCOUNTER — Other Ambulatory Visit: Payer: Self-pay | Admitting: Family Medicine

## 2012-05-19 ENCOUNTER — Telehealth: Payer: Self-pay | Admitting: Family Medicine

## 2012-05-19 NOTE — Telephone Encounter (Signed)
noted 

## 2012-05-23 ENCOUNTER — Emergency Department (HOSPITAL_COMMUNITY): Payer: PRIVATE HEALTH INSURANCE

## 2012-05-23 ENCOUNTER — Encounter (HOSPITAL_COMMUNITY): Payer: Self-pay | Admitting: *Deleted

## 2012-05-23 ENCOUNTER — Emergency Department (HOSPITAL_COMMUNITY)
Admission: EM | Admit: 2012-05-23 | Discharge: 2012-05-24 | Disposition: A | Discharge status: Home / Self Care | Payer: PRIVATE HEALTH INSURANCE | Attending: Emergency Medicine | Admitting: Emergency Medicine

## 2012-05-23 ENCOUNTER — Telehealth: Payer: Self-pay | Admitting: Family Medicine

## 2012-05-23 DIAGNOSIS — IMO0002 Reserved for concepts with insufficient information to code with codable children: Secondary | ICD-10-CM | POA: Insufficient documentation

## 2012-05-23 DIAGNOSIS — Z8541 Personal history of malignant neoplasm of cervix uteri: Secondary | ICD-10-CM | POA: Insufficient documentation

## 2012-05-23 DIAGNOSIS — Z79899 Other long term (current) drug therapy: Secondary | ICD-10-CM | POA: Insufficient documentation

## 2012-05-23 DIAGNOSIS — G8929 Other chronic pain: Secondary | ICD-10-CM | POA: Insufficient documentation

## 2012-05-23 DIAGNOSIS — F17200 Nicotine dependence, unspecified, uncomplicated: Secondary | ICD-10-CM | POA: Insufficient documentation

## 2012-05-23 DIAGNOSIS — F3289 Other specified depressive episodes: Secondary | ICD-10-CM | POA: Insufficient documentation

## 2012-05-23 DIAGNOSIS — Z8673 Personal history of transient ischemic attack (TIA), and cerebral infarction without residual deficits: Secondary | ICD-10-CM | POA: Insufficient documentation

## 2012-05-23 DIAGNOSIS — S0993XA Unspecified injury of face, initial encounter: Secondary | ICD-10-CM | POA: Insufficient documentation

## 2012-05-23 DIAGNOSIS — G44309 Post-traumatic headache, unspecified, not intractable: Secondary | ICD-10-CM | POA: Insufficient documentation

## 2012-05-23 DIAGNOSIS — T07XXXA Unspecified multiple injuries, initial encounter: Secondary | ICD-10-CM | POA: Insufficient documentation

## 2012-05-23 DIAGNOSIS — F329 Major depressive disorder, single episode, unspecified: Secondary | ICD-10-CM | POA: Insufficient documentation

## 2012-05-23 DIAGNOSIS — F411 Generalized anxiety disorder: Secondary | ICD-10-CM | POA: Insufficient documentation

## 2012-05-23 MED ORDER — OXYCODONE-ACETAMINOPHEN 5-325 MG PO TABS
2.0000 | ORAL_TABLET | Freq: Once | ORAL | Status: AC
  Administered 2012-05-23: 2 via ORAL
  Filled 2012-05-23: qty 2

## 2012-05-23 MED ORDER — IBUPROFEN 800 MG PO TABS
800.0000 mg | ORAL_TABLET | Freq: Once | ORAL | Status: AC
  Administered 2012-05-23: 800 mg via ORAL
  Filled 2012-05-23: qty 1

## 2012-05-23 NOTE — Telephone Encounter (Signed)
Patient is aware 
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
## 2012-05-24 MED ORDER — TRAMADOL HCL 50 MG PO TABS: 50.0000 mg | ORAL_TABLET | Freq: Four times a day (QID) | ORAL | Status: DC | PRN

## 2012-05-24 MED ORDER — IBUPROFEN 800 MG PO TABS: 800.0000 mg | ORAL_TABLET | Freq: Three times a day (TID) | ORAL | Status: DC

## 2012-05-24 NOTE — ED Notes (Signed)
Pt alert & oriented x4, stable gait. Patient given discharge instructions, paperwork & prescription(s). Patient  instructed to stop at the registration desk to finish any additional paperwork. Patient verbalized understanding. Pt left department w/ no further questions. 

## 2012-08-04 ENCOUNTER — Emergency Department (HOSPITAL_COMMUNITY): Payer: PRIVATE HEALTH INSURANCE

## 2012-08-04 ENCOUNTER — Emergency Department (HOSPITAL_COMMUNITY)
Admission: EM | Admit: 2012-08-04 | Discharge: 2012-08-04 | Disposition: A | Discharge status: Home / Self Care | Payer: PRIVATE HEALTH INSURANCE | Attending: Emergency Medicine | Admitting: Emergency Medicine

## 2012-08-04 ENCOUNTER — Encounter (HOSPITAL_COMMUNITY): Payer: Self-pay | Admitting: *Deleted

## 2012-08-04 DIAGNOSIS — F329 Major depressive disorder, single episode, unspecified: Secondary | ICD-10-CM | POA: Insufficient documentation

## 2012-08-04 DIAGNOSIS — Z79899 Other long term (current) drug therapy: Secondary | ICD-10-CM | POA: Insufficient documentation

## 2012-08-04 DIAGNOSIS — R079 Chest pain, unspecified: Secondary | ICD-10-CM

## 2012-08-04 DIAGNOSIS — F411 Generalized anxiety disorder: Secondary | ICD-10-CM | POA: Insufficient documentation

## 2012-08-04 DIAGNOSIS — I10 Essential (primary) hypertension: Secondary | ICD-10-CM | POA: Insufficient documentation

## 2012-08-04 DIAGNOSIS — R0602 Shortness of breath: Secondary | ICD-10-CM | POA: Insufficient documentation

## 2012-08-04 DIAGNOSIS — G8929 Other chronic pain: Secondary | ICD-10-CM | POA: Insufficient documentation

## 2012-08-04 DIAGNOSIS — Z8673 Personal history of transient ischemic attack (TIA), and cerebral infarction without residual deficits: Secondary | ICD-10-CM | POA: Insufficient documentation

## 2012-08-04 DIAGNOSIS — F3289 Other specified depressive episodes: Secondary | ICD-10-CM | POA: Insufficient documentation

## 2012-08-04 DIAGNOSIS — L508 Other urticaria: Secondary | ICD-10-CM | POA: Insufficient documentation

## 2012-08-04 DIAGNOSIS — Z8541 Personal history of malignant neoplasm of cervix uteri: Secondary | ICD-10-CM | POA: Insufficient documentation

## 2012-08-04 DIAGNOSIS — F17200 Nicotine dependence, unspecified, uncomplicated: Secondary | ICD-10-CM | POA: Insufficient documentation

## 2012-08-04 LAB — BASIC METABOLIC PANEL
CO2: 26 mEq/L (ref 19–32)
Calcium: 9.9 mg/dL (ref 8.4–10.5)
Chloride: 99 mEq/L (ref 96–112)
Potassium: 3.8 mEq/L (ref 3.5–5.1)
Sodium: 135 mEq/L (ref 135–145)

## 2012-08-04 LAB — HEPATIC FUNCTION PANEL
AST: 18 U/L (ref 0–37)
Bilirubin, Direct: <0.1 mg/dL (ref 0.0–0.3)
Total Protein: 7.6 g/dL (ref 6.0–8.3)

## 2012-08-04 LAB — D-DIMER, QUANTITATIVE: D-Dimer, Quant: 0.48 ug/mL-FEU (ref 0.00–0.48)

## 2012-08-04 LAB — CBC
Hemoglobin: 13.8 g/dL (ref 12.0–15.0)
MCH: 31.7 pg (ref 26.0–34.0)
Platelets: 291 10*3/uL (ref 150–400)
RBC: 4.35 MIL/uL (ref 3.87–5.11)
WBC: 11.7 10*3/uL — ABNORMAL HIGH (ref 4.0–10.5)

## 2012-08-04 LAB — TROPONIN I: Troponin I: <0.3 ng/mL (ref <0.30)

## 2012-08-04 MED ORDER — ASPIRIN 81 MG PO CHEW
324.0000 mg | CHEWABLE_TABLET | Freq: Once | ORAL | Status: AC
  Administered 2012-08-04: 324 mg via ORAL
  Filled 2012-08-04: qty 4

## 2012-08-04 NOTE — ED Notes (Signed)
Patient with no complaints at this time. Respirations even and unlabored. Skin warm/dry. Discharge instructions reviewed with patient at this time. Patient given opportunity to voice concerns/ask questions. Patient discharged at this time and left Emergency Department with steady gait.   

## 2012-08-04 NOTE — ED Notes (Signed)
Patient began having central chest pressure, also under breasts bilaterally, associated w/diaphoresis, chills, SOB yesterday. No N/V or dizziness.  Has felt weaker and "drained" lately, wanting to sleep more.  Went to [facility] yesterday and had BP of 168/119.  PMD is Dr. Jeanice Lim, but she has moved to [place] - decided to come here instead today for convenience.  Rates pressure pain at 6/10.  Attends pain clinic for chronic pain diagnosis.
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
## 2012-09-20 ENCOUNTER — Encounter: Payer: Self-pay | Admitting: Family Medicine

## 2012-09-20 ENCOUNTER — Other Ambulatory Visit: Payer: Self-pay | Admitting: Family Medicine

## 2012-09-20 ENCOUNTER — Ambulatory Visit (INDEPENDENT_AMBULATORY_CARE_PROVIDER_SITE_OTHER): Payer: PRIVATE HEALTH INSURANCE | Admitting: Family Medicine

## 2012-09-20 VITALS — BP 100/60 | HR 84 | Temp 98.2°F | Resp 20 | Wt 113.0 lb

## 2012-09-20 DIAGNOSIS — I10 Essential (primary) hypertension: Secondary | ICD-10-CM

## 2012-09-20 DIAGNOSIS — R634 Abnormal weight loss: Secondary | ICD-10-CM

## 2012-09-20 DIAGNOSIS — D72829 Elevated white blood cell count, unspecified: Secondary | ICD-10-CM | POA: Insufficient documentation

## 2012-09-20 DIAGNOSIS — E785 Hyperlipidemia, unspecified: Secondary | ICD-10-CM

## 2012-09-20 DIAGNOSIS — G8929 Other chronic pain: Secondary | ICD-10-CM

## 2012-09-20 DIAGNOSIS — Z720 Tobacco use: Secondary | ICD-10-CM

## 2012-09-20 DIAGNOSIS — F17200 Nicotine dependence, unspecified, uncomplicated: Secondary | ICD-10-CM

## 2012-09-20 LAB — LIPID PANEL
HDL: 43 mg/dL (ref >39)
LDL Cholesterol: 114 mg/dL — ABNORMAL HIGH (ref 0–99)
Total CHOL/HDL Ratio: 4.4 Ratio
Triglycerides: 168 mg/dL — ABNORMAL HIGH (ref <150)

## 2012-09-20 LAB — COMPREHENSIVE METABOLIC PANEL
ALT: 22 U/L (ref 0–35)
Alkaline Phosphatase: 81 U/L (ref 39–117)
CO2: 28 mEq/L (ref 19–32)
Creat: 0.81 mg/dL (ref 0.50–1.10)
Glucose, Bld: 113 mg/dL — ABNORMAL HIGH (ref 70–99)
Total Bilirubin: 0.3 mg/dL (ref 0.3–1.2)

## 2012-09-20 LAB — CBC WITH DIFFERENTIAL/PLATELET
Eosinophils Relative: 1 % (ref 0–5)
HCT: 39.8 % (ref 36.0–46.0)
Lymphocytes Relative: 28 % (ref 12–46)
Lymphs Abs: 2.2 10*3/uL (ref 0.7–4.0)
MCV: 90 fL (ref 78.0–100.0)
Monocytes Absolute: 0.5 10*3/uL (ref 0.1–1.0)
Monocytes Relative: 6 % (ref 3–12)
RBC: 4.42 MIL/uL (ref 3.87–5.11)
WBC: 7.8 10*3/uL (ref 4.0–10.5)

## 2012-09-20 MED ORDER — SIMVASTATIN 20 MG PO TABS: ORAL_TABLET | ORAL | Status: DC

## 2012-09-20 MED ORDER — ALBUTEROL SULFATE HFA 108 (90 BASE) MCG/ACT IN AERS: 2.0000 | INHALATION_SPRAY | Freq: Four times a day (QID) | RESPIRATORY_TRACT | Status: DC | PRN

## 2012-09-20 NOTE — Assessment & Plan Note (Signed)
Now in pain management

## 2012-09-20 NOTE — Patient Instructions (Signed)
Call [facility] for your PAP Smear  We will call with lab results Continue current medications Continue to work on smoking Start multivitamin  F/U 4 months

## 2012-09-20 NOTE — Assessment & Plan Note (Signed)
counseled on cessation 

## 2012-09-20 NOTE — Assessment & Plan Note (Signed)
Check FLP today and titrate statin as needed

## 2012-09-20 NOTE — Assessment & Plan Note (Signed)
No meds needed  
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
## 2012-09-20 NOTE — Addendum Note (Signed)
Addended by: Milinda Antis F on: 09/20/2012 06:04 PM   Modules accepted: Orders

## 2012-09-20 NOTE — Assessment & Plan Note (Signed)
She has gained some weight since taking meds on regular basis Discussed eating 3 meals a day with snacks She does not eat breakfast, advised to drink her protein shakes at that time instead She is on ADHD meds, which can also interfere with her intake

## 2012-09-20 NOTE — Progress Notes (Signed)
  Subjective:    Patient ID: Brooke Woods, female    DOB: [birth year], 42 y.o.   MRN: [id number]  HPI Patient here to follow chronic medical problems. After her last visit she was accepted by pain clinic is currently under their care. She's also being [facility] by [facility] and is being prescribed her medications for anxiety and depression. She states that she is at a good place in her life right now. She has even started going back to church. Medications were reviewed. She does let me know she failed to mention she had asthma since childhood and that she is out of her albuterol inhaler. She has been worried about her weight but notices when she takes all her psychiatric medications on a regular basis that her appetite improves and she does gained some weight.  Review of Systems GEN- denies fatigue, fever, weight loss,weakness, recent illness HEENT- denies eye drainage, change in vision, nasal discharge, CVS- denies chest pain, palpitations RESP- denies SOB, cough, wheeze ABD- denies N/V, change in stools, abd pain GU- denies dysuria, hematuria, dribbling, incontinence MSK- denies joint pain, muscle aches, injury Neuro- denies headache, dizziness, syncope, seizure activity      Objective:   Physical Exam GEN- NAD, alert and oriented x3, thin female HEENT- PERRL, EOMI, non injected sclera, pink conjunctiva, MMM, oropharynx clear Neck- Supple, no thyromegaly CVS- RRR, no murmur RESP-CTAB EXT- No edema Pulses- Radial 2+        Assessment & Plan:   Overdue for PAP and MAmmo, defer to her GYN

## 2012-12-02 ENCOUNTER — Emergency Department (HOSPITAL_COMMUNITY)
Admission: EM | Admit: 2012-12-02 | Discharge: 2012-12-02 | Disposition: A | Discharge status: Home / Self Care | Payer: PRIVATE HEALTH INSURANCE | Attending: Emergency Medicine | Admitting: Emergency Medicine

## 2012-12-02 ENCOUNTER — Emergency Department (HOSPITAL_COMMUNITY): Payer: PRIVATE HEALTH INSURANCE

## 2012-12-02 ENCOUNTER — Encounter (HOSPITAL_COMMUNITY): Payer: Self-pay | Admitting: Emergency Medicine

## 2012-12-02 DIAGNOSIS — M79644 Pain in right finger(s): Secondary | ICD-10-CM

## 2012-12-02 DIAGNOSIS — Z8673 Personal history of transient ischemic attack (TIA), and cerebral infarction without residual deficits: Secondary | ICD-10-CM | POA: Insufficient documentation

## 2012-12-02 DIAGNOSIS — F17200 Nicotine dependence, unspecified, uncomplicated: Secondary | ICD-10-CM | POA: Insufficient documentation

## 2012-12-02 DIAGNOSIS — J45909 Unspecified asthma, uncomplicated: Secondary | ICD-10-CM | POA: Insufficient documentation

## 2012-12-02 DIAGNOSIS — F3289 Other specified depressive episodes: Secondary | ICD-10-CM | POA: Insufficient documentation

## 2012-12-02 DIAGNOSIS — F411 Generalized anxiety disorder: Secondary | ICD-10-CM | POA: Insufficient documentation

## 2012-12-02 DIAGNOSIS — Z8541 Personal history of malignant neoplasm of cervix uteri: Secondary | ICD-10-CM | POA: Insufficient documentation

## 2012-12-02 DIAGNOSIS — F329 Major depressive disorder, single episode, unspecified: Secondary | ICD-10-CM | POA: Insufficient documentation

## 2012-12-02 DIAGNOSIS — Z79899 Other long term (current) drug therapy: Secondary | ICD-10-CM | POA: Insufficient documentation

## 2012-12-02 DIAGNOSIS — S6990XA Unspecified injury of unspecified wrist, hand and finger(s), initial encounter: Secondary | ICD-10-CM | POA: Insufficient documentation

## 2012-12-02 DIAGNOSIS — S6980XA Other specified injuries of unspecified wrist, hand and finger(s), initial encounter: Secondary | ICD-10-CM | POA: Insufficient documentation

## 2012-12-02 NOTE — ED Provider Notes (Signed)
Medical screening examination/treatment/procedure(s) were performed by non-physician practitioner and as supervising physician I was immediately available for consultation/collaboration.  EKG Interpretation   None         Leylany Nored L Shylee Durrett, MD 12/02/12 1444 
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
## 2013-01-22 ENCOUNTER — Ambulatory Visit: Payer: PRIVATE HEALTH INSURANCE | Admitting: Family Medicine

## 2013-04-18 DIAGNOSIS — L84 Corns and callosities: Secondary | ICD-10-CM | POA: Insufficient documentation

## 2013-04-18 DIAGNOSIS — Q667 Congenital pes cavus, unspecified foot: Secondary | ICD-10-CM | POA: Insufficient documentation

## 2013-04-18 DIAGNOSIS — Q666 Other congenital valgus deformities of feet: Secondary | ICD-10-CM | POA: Insufficient documentation

## 2013-04-18 DIAGNOSIS — M2010 Hallux valgus (acquired), unspecified foot: Secondary | ICD-10-CM | POA: Insufficient documentation

## 2013-04-18 DIAGNOSIS — M21869 Other specified acquired deformities of unspecified lower leg: Secondary | ICD-10-CM | POA: Insufficient documentation

## 2013-08-31 ENCOUNTER — Other Ambulatory Visit: Payer: PRIVATE HEALTH INSURANCE

## 2013-08-31 ENCOUNTER — Other Ambulatory Visit: Payer: Self-pay | Admitting: Family Medicine

## 2013-08-31 ENCOUNTER — Ambulatory Visit: Payer: Self-pay | Admitting: Family Medicine

## 2013-08-31 DIAGNOSIS — B1710 Acute hepatitis C without hepatic coma: Secondary | ICD-10-CM

## 2013-08-31 DIAGNOSIS — E785 Hyperlipidemia, unspecified: Secondary | ICD-10-CM

## 2013-09-01 LAB — CBC WITH DIFFERENTIAL/PLATELET
Basophils Absolute: 0 10*3/uL (ref 0.0–0.1)
Basophils Relative: 0 % (ref 0–1)
EOS ABS: 0.1 10*3/uL (ref 0.0–0.7)
EOS PCT: 1 % (ref 0–5)
HCT: 41.9 % (ref 36.0–46.0)
HEMOGLOBIN: 14.6 g/dL (ref 12.0–15.0)
LYMPHS ABS: 3.4 10*3/uL (ref 0.7–4.0)
LYMPHS PCT: 35 % (ref 12–46)
MCH: 30.4 pg (ref 26.0–34.0)
MCHC: 34.8 g/dL (ref 30.0–36.0)
MCV: 87.3 fL (ref 78.0–100.0)
MONOS PCT: 7 % (ref 3–12)
Monocytes Absolute: 0.7 10*3/uL (ref 0.1–1.0)
Neutro Abs: 5.5 10*3/uL (ref 1.7–7.7)
Neutrophils Relative %: 57 % (ref 43–77)
PLATELETS: 378 10*3/uL (ref 150–400)
RBC: 4.8 MIL/uL (ref 3.87–5.11)
RDW: 15.1 % (ref 11.5–15.5)
WBC: 9.6 10*3/uL (ref 4.0–10.5)

## 2013-09-01 LAB — ACUTE HEP PANEL AND HEP B SURFACE AB
HCV Ab: REACTIVE — AB
Hep A IgM: NONREACTIVE
Hep B C IgM: NONREACTIVE
Hep B S Ab: NEGATIVE
Hepatitis B Surface Ag: NEGATIVE

## 2013-09-01 LAB — COMPREHENSIVE METABOLIC PANEL
ALT: 235 U/L — AB (ref 0–35)
AST: 228 U/L — ABNORMAL HIGH (ref 0–37)
Albumin: 3.8 g/dL (ref 3.5–5.2)
Alkaline Phosphatase: 257 U/L — ABNORMAL HIGH (ref 39–117)
BILIRUBIN TOTAL: 0.7 mg/dL (ref 0.2–1.2)
BUN: 9 mg/dL (ref 6–23)
CALCIUM: 9.3 mg/dL (ref 8.4–10.5)
CHLORIDE: 106 meq/L (ref 96–112)
CO2: 27 meq/L (ref 19–32)
CREATININE: 0.73 mg/dL (ref 0.50–1.10)
GLUCOSE: 102 mg/dL — AB (ref 70–99)
Potassium: 4.3 mEq/L (ref 3.5–5.3)
SODIUM: 138 meq/L (ref 135–145)
TOTAL PROTEIN: 7 g/dL (ref 6.0–8.3)

## 2013-09-01 LAB — LIPID PANEL
CHOLESTEROL: 164 mg/dL (ref 0–200)
HDL: 55 mg/dL (ref >39)
LDL Cholesterol: 82 mg/dL (ref 0–99)
TRIGLYCERIDES: 135 mg/dL (ref <150)
Total CHOL/HDL Ratio: 3 Ratio
VLDL: 27 mg/dL (ref 0–40)

## 2013-09-03 ENCOUNTER — Ambulatory Visit (INDEPENDENT_AMBULATORY_CARE_PROVIDER_SITE_OTHER): Payer: PRIVATE HEALTH INSURANCE | Admitting: Family Medicine

## 2013-09-03 ENCOUNTER — Encounter: Payer: Self-pay | Admitting: Family Medicine

## 2013-09-03 VITALS — BP 128/80 | HR 72 | Temp 98.4°F | Resp 14 | Ht 65.0 in | Wt 115.0 lb

## 2013-09-03 DIAGNOSIS — J45909 Unspecified asthma, uncomplicated: Secondary | ICD-10-CM | POA: Insufficient documentation

## 2013-09-03 DIAGNOSIS — G8929 Other chronic pain: Secondary | ICD-10-CM

## 2013-09-03 DIAGNOSIS — F32A Depression, unspecified: Secondary | ICD-10-CM

## 2013-09-03 DIAGNOSIS — F329 Major depressive disorder, single episode, unspecified: Secondary | ICD-10-CM

## 2013-09-03 DIAGNOSIS — F3289 Other specified depressive episodes: Secondary | ICD-10-CM

## 2013-09-03 DIAGNOSIS — R768 Other specified abnormal immunological findings in serum: Secondary | ICD-10-CM

## 2013-09-03 DIAGNOSIS — Z720 Tobacco use: Secondary | ICD-10-CM

## 2013-09-03 DIAGNOSIS — F17200 Nicotine dependence, unspecified, uncomplicated: Secondary | ICD-10-CM

## 2013-09-03 DIAGNOSIS — J4520 Mild intermittent asthma, uncomplicated: Secondary | ICD-10-CM

## 2013-09-03 DIAGNOSIS — R945 Abnormal results of liver function studies: Secondary | ICD-10-CM

## 2013-09-03 DIAGNOSIS — E785 Hyperlipidemia, unspecified: Secondary | ICD-10-CM

## 2013-09-03 DIAGNOSIS — R7989 Other specified abnormal findings of blood chemistry: Secondary | ICD-10-CM | POA: Insufficient documentation

## 2013-09-03 DIAGNOSIS — R894 Abnormal immunological findings in specimens from other organs, systems and tissues: Secondary | ICD-10-CM

## 2013-09-03 LAB — RPR

## 2013-09-03 LAB — HIV ANTIBODY (ROUTINE TESTING W REFLEX): HIV: NONREACTIVE

## 2013-09-03 MED ORDER — BUSPIRONE HCL 10 MG PO TABS: 10.0000 mg | ORAL_TABLET | Freq: Three times a day (TID) | ORAL | Status: DC

## 2013-09-03 MED ORDER — ALBUTEROL SULFATE HFA 108 (90 BASE) MCG/ACT IN AERS: 2.0000 | INHALATION_SPRAY | Freq: Four times a day (QID) | RESPIRATORY_TRACT | Status: DC | PRN

## 2013-09-03 NOTE — Assessment & Plan Note (Signed)
Continue with pain clinic, obtain records from surgery

## 2013-09-03 NOTE — Assessment & Plan Note (Signed)
Secondary to hepatitis C but will discontinue her statin drug at this time

## 2013-09-03 NOTE — Assessment & Plan Note (Signed)
Reactive hepatitis C. She states that she is in a new relationship however he has been tested. I will check a hepatitis C quantitative genotype I will also obtain an HIV she will be referred to GI in [place] for treatment

## 2013-09-03 NOTE — Assessment & Plan Note (Signed)
Albuterol given, obtain PFT

## 2013-09-03 NOTE — Assessment & Plan Note (Signed)
Continue to smoke, has some wheezing, albuterol refilled, will need to check for COPD, told she had asthma bronchitis in the past

## 2013-09-03 NOTE — Assessment & Plan Note (Signed)
Continue current meds, advised I will not increase her xanax , especially with her other high dose narcotics and medications, will restart buspar 10mg  BID for anxiety

## 2013-09-03 NOTE — Patient Instructions (Signed)
Start the buspar 10mg  twice a day for 2 weeks Then increase to the 1 in middle day as needed Referral to liver doctor  Stop the zocor Release of records- [facility] F/U 3 months

## 2013-09-03 NOTE — Assessment & Plan Note (Signed)
Well controlled however with elevated LFT, will discontinue for her Hepc C work up

## 2013-09-03 NOTE — Progress Notes (Signed)
Patient ID: Brooke Woods, female   DOB: Nov 27, 1970, 43 y.o.   MRN: [id number]   Subjective:    Patient ID: Brooke Woods, female    DOB: 09/24/1970, 43 y.o.   MRN: [id number]  Patient presents for HTN/ Cholesterol and F/U Labs  patient here follow chronic medical problems. She still being followed in pain clinic she's also being followed by psychiatry for her depression and anxiety. She states that she was put on Diovan for ADHD as well but she stopped taking this medication as it was causing weight loss and her anxiety was works as she's been dealing with the care of her mother who is been very sick recently as well as caring for her grandchildren and her daughter the with her. She wanted to have her Xanax increased to 1 mg 3 times a day states she was previously on 4 times a day and her psychiatrist we'll not do this. Regarding her pain medication she did have a foot surgery done in [place] about 6 months ago which is well-healed she still maintained on high-dose narcotics for this in her chronic pain.  Hepatitis C she went to the blood bank to give plasma was told that she was positive for hepatitis C and cannot donate. Her last a normal well her function tests were done last year in August she's never had any hepatitis screening but states that she often provides blood. She does have a family history of hepatitis C her brother has this which was contracted when he was in jail he also use a lot of needles for tattoos and drug abuse. Her sister also contracted hepatitis C from sharing needles with her drug habit. Patient denies any drug abuse any needle sharing she denies any new tattoos she does have one on her leg which is been present for 7 years she's not had any recent piercings she does not remember she required any blood products with her foot surgery.    Review Of Systems:  GEN- denies fatigue, fever, weight loss,weakness, recent illness HEENT- denies eye drainage, change in vision,  nasal discharge, CVS- denies chest pain, palpitations RESP- denies SOB, cough, wheeze ABD- denies N/V, change in stools, abd pain GU- denies dysuria, hematuria, dribbling, incontinence MSK- denies joint pain, muscle aches, injury Neuro- denies headache, dizziness, syncope, seizure activity       Objective:    BP 128/80  Pulse 72  Temp(Src) 98.4 F (36.9 C) (Oral)  Resp 14  Ht 5\' 5"  (1.651 m)  Wt 115 lb (52.164 kg)  BMI 19.14 kg/m2  LMP 07/22/2013 GEN- NAD, alert and oriented x3 HEENT- PERRL, EOMI, non injected sclera, pink conjunctiva, MMM, oropharynx clear Neck- Supple, no LAD CVS- RRR, no murmur RESP-CTAB ABD-NABS,soft,NT,ND, no HSM EXT- No edema Psych- tearful discussing hep c, Not overly anxious, no SI, well groomed Pulses- Radial 2+        Assessment & Plan:      Problem List Items Addressed This Visit   Hepatitis C reactive - Primary   Relevant Orders      HCV RNA quant rflx ultra or genotyp      HIV antibody      RPR      Note: This dictation was prepared with Dragon dictation along with smaller phrase technology. Any transcriptional errors that result from this process are unintentional.

## 2013-09-05 LAB — HCV RNA QUANT RFLX ULTRA OR GENOTYP
HCV QUANT LOG: 7.25 {Log} — AB (ref <1.18)
HCV Quantitative: 17621958 IU/mL — ABNORMAL HIGH (ref <15)

## 2013-09-05 NOTE — Addendum Note (Signed)
Addended by: Vic Blackbird F on: 09/05/2013 03:29 PM   Modules accepted: Orders

## 2013-09-06 LAB — HEPATITIS C GENOTYPE

## 2013-09-11 ENCOUNTER — Ambulatory Visit (HOSPITAL_COMMUNITY): Payer: PRIVATE HEALTH INSURANCE | Attending: Family Medicine

## 2013-09-21 ENCOUNTER — Telehealth: Payer: Self-pay | Admitting: Family Medicine

## 2013-09-21 NOTE — Telephone Encounter (Signed)
Call was placed in regards to patient mother.   Please see documentation in mother's chart.

## 2013-09-21 NOTE — Telephone Encounter (Signed)
Patient left message that she was calling you back not specific about the nature of the call  [phone]

## 2013-09-24 ENCOUNTER — Encounter (INDEPENDENT_AMBULATORY_CARE_PROVIDER_SITE_OTHER): Payer: Self-pay | Admitting: *Deleted

## 2013-10-24 ENCOUNTER — Ambulatory Visit (INDEPENDENT_AMBULATORY_CARE_PROVIDER_SITE_OTHER): Payer: PRIVATE HEALTH INSURANCE | Admitting: Internal Medicine

## 2013-10-31 ENCOUNTER — Telehealth (INDEPENDENT_AMBULATORY_CARE_PROVIDER_SITE_OTHER): Payer: Self-pay | Admitting: *Deleted

## 2013-10-31 ENCOUNTER — Ambulatory Visit (INDEPENDENT_AMBULATORY_CARE_PROVIDER_SITE_OTHER): Payer: PRIVATE HEALTH INSURANCE | Admitting: Internal Medicine

## 2013-10-31 ENCOUNTER — Encounter (INDEPENDENT_AMBULATORY_CARE_PROVIDER_SITE_OTHER): Payer: Self-pay | Admitting: *Deleted

## 2013-10-31 NOTE — Telephone Encounter (Signed)
Brooke Woods NO SHOWED for her apt with Deberah Castle, NP on 10/31/13. A NS letter has been mailed.

## 2013-12-10 ENCOUNTER — Encounter: Payer: Self-pay | Admitting: Family Medicine

## 2014-07-22 ENCOUNTER — Emergency Department (HOSPITAL_COMMUNITY): Payer: Medicare Other

## 2014-07-22 ENCOUNTER — Encounter (HOSPITAL_COMMUNITY): Payer: Self-pay | Admitting: Emergency Medicine

## 2014-07-22 ENCOUNTER — Emergency Department (HOSPITAL_COMMUNITY)
Admission: EM | Admit: 2014-07-22 | Discharge: 2014-07-22 | Disposition: A | Discharge status: Home / Self Care | Payer: Medicare Other | Attending: Emergency Medicine | Admitting: Emergency Medicine

## 2014-07-22 DIAGNOSIS — J45909 Unspecified asthma, uncomplicated: Secondary | ICD-10-CM | POA: Insufficient documentation

## 2014-07-22 DIAGNOSIS — Z720 Tobacco use: Secondary | ICD-10-CM | POA: Diagnosis not present

## 2014-07-22 DIAGNOSIS — F329 Major depressive disorder, single episode, unspecified: Secondary | ICD-10-CM | POA: Diagnosis not present

## 2014-07-22 DIAGNOSIS — Z8673 Personal history of transient ischemic attack (TIA), and cerebral infarction without residual deficits: Secondary | ICD-10-CM | POA: Insufficient documentation

## 2014-07-22 DIAGNOSIS — N12 Tubulo-interstitial nephritis, not specified as acute or chronic: Secondary | ICD-10-CM

## 2014-07-22 DIAGNOSIS — R059 Cough, unspecified: Secondary | ICD-10-CM

## 2014-07-22 DIAGNOSIS — Z8541 Personal history of malignant neoplasm of cervix uteri: Secondary | ICD-10-CM | POA: Insufficient documentation

## 2014-07-22 DIAGNOSIS — R109 Unspecified abdominal pain: Secondary | ICD-10-CM | POA: Diagnosis present

## 2014-07-22 DIAGNOSIS — R05 Cough: Secondary | ICD-10-CM | POA: Diagnosis not present

## 2014-07-22 DIAGNOSIS — Z79899 Other long term (current) drug therapy: Secondary | ICD-10-CM | POA: Diagnosis not present

## 2014-07-22 DIAGNOSIS — F419 Anxiety disorder, unspecified: Secondary | ICD-10-CM | POA: Insufficient documentation

## 2014-07-22 DIAGNOSIS — G8929 Other chronic pain: Secondary | ICD-10-CM | POA: Diagnosis not present

## 2014-07-22 DIAGNOSIS — R000 Tachycardia, unspecified: Secondary | ICD-10-CM | POA: Diagnosis not present

## 2014-07-22 LAB — CBC WITH DIFFERENTIAL/PLATELET
BASOS ABS: 0 10*3/uL (ref 0.0–0.1)
Basophils Relative: 0 % (ref 0–1)
Eosinophils Absolute: 0 10*3/uL (ref 0.0–0.7)
Eosinophils Relative: 0 % (ref 0–5)
HEMATOCRIT: 45 % (ref 36.0–46.0)
HEMOGLOBIN: 15.5 g/dL — AB (ref 12.0–15.0)
Lymphocytes Relative: 8 % — ABNORMAL LOW (ref 12–46)
Lymphs Abs: 1.6 10*3/uL (ref 0.7–4.0)
MCH: 32.4 pg (ref 26.0–34.0)
MCHC: 34.4 g/dL (ref 30.0–36.0)
MCV: 93.9 fL (ref 78.0–100.0)
MONO ABS: 1.4 10*3/uL — AB (ref 0.1–1.0)
Monocytes Relative: 7 % (ref 3–12)
Neutro Abs: 16.7 10*3/uL — ABNORMAL HIGH (ref 1.7–7.7)
Neutrophils Relative %: 85 % — ABNORMAL HIGH (ref 43–77)
Platelets: 212 10*3/uL (ref 150–400)
RBC: 4.79 MIL/uL (ref 3.87–5.11)
RDW: 13.8 % (ref 11.5–15.5)
WBC: 19.6 10*3/uL — ABNORMAL HIGH (ref 4.0–10.5)

## 2014-07-22 LAB — URINALYSIS, ROUTINE W REFLEX MICROSCOPIC
BILIRUBIN URINE: NEGATIVE
GLUCOSE, UA: NEGATIVE mg/dL
Ketones, ur: NEGATIVE mg/dL
Nitrite: NEGATIVE
PH: 6 (ref 5.0–8.0)
Protein, ur: NEGATIVE mg/dL
Specific Gravity, Urine: <1.005 — ABNORMAL LOW (ref 1.005–1.030)
Urobilinogen, UA: 0.2 mg/dL (ref 0.0–1.0)

## 2014-07-22 LAB — COMPREHENSIVE METABOLIC PANEL
ALK PHOS: 122 U/L (ref 38–126)
ALT: 77 U/L — AB (ref 14–54)
AST: 82 U/L — ABNORMAL HIGH (ref 15–41)
Albumin: 3.7 g/dL (ref 3.5–5.0)
Anion gap: 10 (ref 5–15)
BUN: 13 mg/dL (ref 6–20)
CHLORIDE: 98 mmol/L — AB (ref 101–111)
CO2: 27 mmol/L (ref 22–32)
CREATININE: 0.85 mg/dL (ref 0.44–1.00)
Calcium: 8.9 mg/dL (ref 8.9–10.3)
GFR calc Af Amer: >60 mL/min (ref >60)
GFR calc non Af Amer: >60 mL/min (ref >60)
Glucose, Bld: 109 mg/dL — ABNORMAL HIGH (ref 65–99)
Potassium: 4.2 mmol/L (ref 3.5–5.1)
Sodium: 135 mmol/L (ref 135–145)
Total Bilirubin: 0.8 mg/dL (ref 0.3–1.2)
Total Protein: 8.1 g/dL (ref 6.5–8.1)

## 2014-07-22 LAB — URINE MICROSCOPIC-ADD ON

## 2014-07-22 MED ORDER — CIPROFLOXACIN HCL 500 MG PO TABS: 500.0000 mg | ORAL_TABLET | Freq: Two times a day (BID) | ORAL | Status: DC

## 2014-07-22 MED ORDER — IBUPROFEN 800 MG PO TABS: 800.0000 mg | ORAL_TABLET | Freq: Three times a day (TID) | ORAL | Status: DC | PRN

## 2014-07-22 MED ORDER — MORPHINE SULFATE 4 MG/ML IJ SOLN
4.0000 mg | Freq: Once | INTRAMUSCULAR | Status: AC
  Administered 2014-07-22: 4 mg via INTRAVENOUS
  Filled 2014-07-22: qty 1

## 2014-07-22 MED ORDER — KETOROLAC TROMETHAMINE 30 MG/ML IJ SOLN
30.0000 mg | Freq: Once | INTRAMUSCULAR | Status: AC
  Administered 2014-07-22: 30 mg via INTRAVENOUS
  Filled 2014-07-22: qty 1

## 2014-07-22 MED ORDER — SODIUM CHLORIDE 0.9 % IV BOLUS (SEPSIS)
1000.0000 mL | Freq: Once | INTRAVENOUS | Status: AC
  Administered 2014-07-22: 1000 mL via INTRAVENOUS

## 2014-07-22 MED ORDER — ONDANSETRON 4 MG PO TBDP: 4.0000 mg | ORAL_TABLET | Freq: Three times a day (TID) | ORAL | Status: DC | PRN

## 2014-07-22 MED ORDER — CEPHALEXIN 500 MG PO CAPS: 500.0000 mg | ORAL_CAPSULE | Freq: Two times a day (BID) | ORAL | Status: DC

## 2014-07-22 MED ORDER — CEFTRIAXONE SODIUM 1 G IJ SOLR
1.0000 g | Freq: Once | INTRAMUSCULAR | Status: AC
  Administered 2014-07-22: 1 g via INTRAVENOUS
  Filled 2014-07-22: qty 10

## 2014-07-22 NOTE — Discharge Instructions (Signed)
Pyelonephritis, Adult °Pyelonephritis is a kidney infection. In general, there are 2 main types of pyelonephritis: °· Infections that come on quickly without any warning (acute pyelonephritis). °· Infections that persist for a long period of time (chronic pyelonephritis). °CAUSES  °Two main causes of pyelonephritis are: °· Bacteria traveling from the bladder to the kidney. This is a problem especially in pregnant women. The urine in the bladder can become filled with bacteria from multiple causes, including: °¨ Inflammation of the prostate gland (prostatitis). °¨ Sexual intercourse in females. °¨ Bladder infection (cystitis). °· Bacteria traveling from the bloodstream to the tissue part of the kidney. °Problems that may increase your risk of getting a kidney infection include: °· Diabetes. °· Kidney stones or bladder stones. °· Cancer. °· Catheters placed in the bladder. °· Other abnormalities of the kidney or ureter. °SYMPTOMS  °· Abdominal pain. °· Pain in the side or flank area. °· Fever. °· Chills. °· Upset stomach. °· Blood in the urine (dark urine). °· Frequent urination. °· Strong or persistent urge to urinate. °· Burning or stinging when urinating. °DIAGNOSIS  °Your caregiver may diagnose your kidney infection based on your symptoms. A urine sample may also be taken. °TREATMENT  °In general, treatment depends on how severe the infection is.  °· If the infection is mild and caught early, your caregiver may treat you with oral antibiotics and send you home. °· If the infection is more severe, the bacteria may have gotten into the bloodstream. This will require intravenous (IV) antibiotics and a hospital stay. Symptoms may include: °¨ High fever. °¨ Severe flank pain. °¨ Shaking chills. °· Even after a hospital stay, your caregiver may require you to be on oral antibiotics for a period of time. °· Other treatments may be required depending upon the cause of the infection. °HOME CARE INSTRUCTIONS  °· Take your  antibiotics as directed. Finish them even if you start to feel better. °· Make an appointment to have your urine checked to make sure the infection is gone. °· Drink enough fluids to keep your urine clear or pale yellow. °· Take medicines for the bladder if you have urgency and frequency of urination as directed by your caregiver. °SEEK IMMEDIATE MEDICAL CARE IF:  °· You have a fever or persistent symptoms for more than 2-3 days. °· You have a fever and your symptoms suddenly get worse. °· You are unable to take your antibiotics or fluids. °· You develop shaking chills. °· You experience extreme weakness or fainting. °· There is no improvement after 2 days of treatment. °MAKE SURE YOU: °· Understand these instructions. °· Will watch your condition. °· Will get help right away if you are not doing well or get worse. °Document Released: 01/25/2005 Document Revised: 07/27/2011 Document Reviewed: 07/01/2010 °[employer]® Patient Information ©2015 [employer]. This information is not intended to replace advice given to you by your health care provider. Make sure you discuss any questions you have with your health care provider. ° °

## 2014-07-22 NOTE — ED Provider Notes (Addendum)
TIME SEEN: 3:15 PM  CHIEF COMPLAINT: Left flank pain, urinary urgency  HPI: Pt is a 44 y.o. female with history of depression, anxiety, chronic pain who receives oxycodone from Dr. Buelah Manis who presents to the emergency department with 3 days of left flank pain. 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 She does not remember injuring herself while doing this however. She states today she had urinary urgency. No dysuria or hematuria. Denies prior history of kidney stones. No numbness, tingling or focal weakness. Pain is sharp and radiates into the lower abdomen. Described as moderate to severe. Worse with movement and better with staying still. She denies nausea, vomiting or diarrhea.  Also states she has had cough for several weeks. No shortness of breath. No chest pain. No fever.  ROS: See HPI Constitutional: no fever  Eyes: no drainage  ENT: no runny nose   Cardiovascular:  no chest pain  Resp: no SOB  GI: no vomiting GU: no dysuria Integumentary: no rash  Allergy: no hives  Musculoskeletal: no leg swelling  Neurological: no slurred speech ROS otherwise negative  PAST MEDICAL HISTORY/PAST SURGICAL HISTORY:  Past Medical History  Diagnosis Date  . Depression   . Anxiety   . Chronic pain   . Stroke     Pt reported TIA 04/27/10  . Cancer     Pt states she has had cervical cancer  . Asthmatic bronchitis     MEDICATIONS:  Prior to Admission medications   Medication Sig Start Date End Date Taking? Authorizing Provider  albuterol (PROVENTIL HFA;VENTOLIN HFA) 108 (90 BASE) MCG/ACT inhaler Inhale 2 puffs into the lungs every 6 (six) hours as needed for wheezing. 09/03/13   Alycia Rossetti, MD  ALPRAZolam Duanne Moron) 1 MG tablet Take 1 mg by mouth 2 (two) times daily.    Historical Provider, MD  busPIRone (BUSPAR) 10 MG tablet Take 1 tablet (10 mg total) by mouth 3 (three) times daily. 09/03/13   Alycia Rossetti, MD  DULoxetine (CYMBALTA) 60 MG capsule Take 60 mg by mouth daily.      Historical Provider, MD  oxyCODONE (ROXICODONE) 15 MG immediate release tablet Take 15 mg by mouth every 4 (four) hours as needed for pain.    Historical Provider, MD  tiZANidine (ZANAFLEX) 4 MG tablet Take 4 mg by mouth every 6 (six) hours as needed for muscle spasms.    Historical Provider, MD  traZODone (DESYREL) 100 MG tablet Take 100-200 mg by mouth at bedtime as needed for sleep.     Historical Provider, MD    ALLERGIES:  No Known Allergies  SOCIAL HISTORY:  History  Substance Use Topics  . Smoking status: Current Every Day Smoker -- 1.00 packs/day    Types: Cigarettes  . Smokeless tobacco: Not on file  . Alcohol Use: No    FAMILY HISTORY: Family History  Problem Relation Age of Onset  . Hypertension Mother   . Hyperlipidemia Mother   . Depression Mother   . Cancer Father   . Hypertension Father   . Hyperlipidemia Father   . Hyperlipidemia Sister   . Hyperlipidemia Brother   . Depression Brother     EXAM: BP 130/91 mmHg  Pulse 122  Temp(Src) 98.7 F (37.1 C) (Oral)  Resp 20  Ht 5\' 7"  (1.702 m)  Wt 108 lb (48.988 kg)  BMI 16.91 kg/m2  SpO2 99%  LMP 06/24/2014  CONSTITUTIONAL: Alert and oriented and responds appropriately to questions. Appears uncomfortable but nontoxic, afebrile HEAD: Normocephalic EYES: Conjunctivae clear, PERRL ENT: normal nose; no rhinorrhea; moist mucous membranes; pharynx without lesions noted NECK: Supple, no meningismus, no LAD  CARD: Regular and tachycardic; S1 and S2 appreciated; no murmurs, no clicks, no rubs, no gallops RESP: Normal chest excursion without splinting or tachypnea; breath sounds clear and equal bilaterally; no wheezes, no rhonchi, no rales, no hypoxia or respiratory distress, speaking full sentences ABD/GI: Normal bowel sounds; non-distended; soft, non-tender, no rebound, no guarding, no peritoneal signs BACK:  The back appears normal and  is tender to palpation over the left flank and left lumbar paraspinal musculature and left lateral abdomen EXT: Normal ROM in all joints; non-tender to palpation; no edema; normal capillary refill; no cyanosis, no calf tenderness or swelling    SKIN: Normal color for age and race; warm NEURO: Moves all extremities equally, sensation to light touch intact diffusely, cranial nerves II through XII intact PSYCH: The patient's mood and manner are appropriate. Grooming and personal hygiene are appropriate.  MEDICAL DECISION MAKING: Patient here with left flank pain. She does have microscopic hematuria, leukocytes and bacteria. We'll send urine culture and give ceftriaxone. Pain may be musculoskeletal in nature but could also be kidney stone, UTI, pyelonephritis. Complaining of cough for the past several weeks. She is a smoker. Her lungs are clear and she is not hypoxic. We'll obtain labs, chest x-ray and CT of her abdomen and pelvis. We'll give Toradol for pain and reassess.   ED PROGRESS: Patient does have a leukocytosis of 19.6 with left shift. Her tachycardia has resolved and her heart rate is now on the 90s. She is afebrile and otherwise hemodynamically stable. Nontoxic appearing. Appears very comfortable currently. CT scan shows no renal or ureteral stones. No hydronephrosis. Left kidney does appear enlarged with surrounding inflammation suggesting pyelonephritis. She reports feeling much better after Toradol but is still having some pain. Will give dose of morphine in the ED. Have discussed these findings with patient and have offered admission but she states she would prefer to go home. She has an appointment to follow up with her primary care physician Dr. Buelah Manis in 4 days. Discussed strict return precautions. We'll discharge with Cipro for 14 days and Zofran. She is already on oxycodone 10 mg and home for chronic pain. I will not send her with any more narcotics and she understands this. She verbalized  understanding and is comfortable with this plan.  Chest x-ray was clear.    EKG Interpretation  Date/Time:  Monday July 22 2014 16:19:33 EDT Ventricular Rate:  81 PR Interval:  136 QRS Duration: 102 QT Interval:  429 QTC Calculation: 498 R Axis:   84 Text Interpretation:  Sinus rhythm Incomplete right bundle branch block Borderline prolonged QT interval No significant change since last tracing Confirmed by Ebbie Sorenson,  DO, Bryker Fletchall [phone]) on 07/22/2014 4:31:00 PM         Delice Bison Suliman Termini, DO 07/22/14 [place], DO 07/22/14 1716

## 2014-07-22 NOTE — ED Notes (Signed)
MD at bedside. 

## 2014-07-22 NOTE — ED Notes (Signed)
Patient complaining of left flank pain x 3 days. 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

## 2014-07-24 LAB — URINE CULTURE: Colony Count: >=100000

## 2014-07-25 ENCOUNTER — Telehealth (HOSPITAL_BASED_OUTPATIENT_CLINIC_OR_DEPARTMENT_OTHER): Payer: Self-pay | Admitting: Emergency Medicine

## 2014-07-25 NOTE — Telephone Encounter (Signed)
Post ED Visit - Positive Culture Follow-up  Culture report reviewed by antimicrobial stewardship pharmacist: []  Wes Cade, Pharm.D., BCPS [x]  Heide Guile, Pharm.D., BCPS []  Alycia Rossetti, Pharm.D., BCPS []  [place], [place].D., BCPS, AAHIVP []  Legrand Como, Pharm.D., BCPS, AAHIVP []  Isac Sarna, Pharm.D., BCPS  Positive urine culture E. Coli Treated with cephalexin and ciprofloxacin, organism sensitive to the same and no further patient follow-up is required at this time.  [name] 07/25/2014, 12:53 PM

## 2014-08-05 ENCOUNTER — Encounter: Payer: Self-pay | Admitting: Family Medicine

## 2014-08-05 ENCOUNTER — Ambulatory Visit (INDEPENDENT_AMBULATORY_CARE_PROVIDER_SITE_OTHER): Payer: Medicare Other | Admitting: Family Medicine

## 2014-08-05 VITALS — BP 128/78 | HR 70 | Temp 98.1°F | Resp 16 | Ht 67.0 in | Wt 100.0 lb

## 2014-08-05 DIAGNOSIS — E785 Hyperlipidemia, unspecified: Secondary | ICD-10-CM | POA: Diagnosis not present

## 2014-08-05 DIAGNOSIS — I10 Essential (primary) hypertension: Secondary | ICD-10-CM | POA: Diagnosis not present

## 2014-08-05 DIAGNOSIS — R768 Other specified abnormal immunological findings in serum: Secondary | ICD-10-CM

## 2014-08-05 DIAGNOSIS — R894 Abnormal immunological findings in specimens from other organs, systems and tissues: Secondary | ICD-10-CM

## 2014-08-05 DIAGNOSIS — J069 Acute upper respiratory infection, unspecified: Secondary | ICD-10-CM | POA: Diagnosis not present

## 2014-08-05 DIAGNOSIS — Z720 Tobacco use: Secondary | ICD-10-CM

## 2014-08-05 DIAGNOSIS — F329 Major depressive disorder, single episode, unspecified: Secondary | ICD-10-CM

## 2014-08-05 DIAGNOSIS — F411 Generalized anxiety disorder: Secondary | ICD-10-CM | POA: Diagnosis not present

## 2014-08-05 DIAGNOSIS — F32A Depression, unspecified: Secondary | ICD-10-CM

## 2014-08-05 DIAGNOSIS — R634 Abnormal weight loss: Secondary | ICD-10-CM

## 2014-08-05 LAB — CBC WITH DIFFERENTIAL/PLATELET
BASOS ABS: 0.1 10*3/uL (ref 0.0–0.1)
Basophils Relative: 1 % (ref 0–1)
EOS PCT: 2 % (ref 0–5)
Eosinophils Absolute: 0.2 10*3/uL (ref 0.0–0.7)
HCT: 44.7 % (ref 36.0–46.0)
Hemoglobin: 15 g/dL (ref 12.0–15.0)
LYMPHS PCT: 47 % — AB (ref 12–46)
Lymphs Abs: 3.8 10*3/uL (ref 0.7–4.0)
MCH: 31.3 pg (ref 26.0–34.0)
MCHC: 33.6 g/dL (ref 30.0–36.0)
MCV: 93.1 fL (ref 78.0–100.0)
MONO ABS: 0.4 10*3/uL (ref 0.1–1.0)
MPV: 10.5 fL (ref 8.6–12.4)
Monocytes Relative: 5 % (ref 3–12)
Neutro Abs: 3.6 10*3/uL (ref 1.7–7.7)
Neutrophils Relative %: 45 % (ref 43–77)
PLATELETS: 452 10*3/uL — AB (ref 150–400)
RBC: 4.8 MIL/uL (ref 3.87–5.11)
RDW: 14.3 % (ref 11.5–15.5)
WBC: 8 10*3/uL (ref 4.0–10.5)

## 2014-08-05 LAB — TSH: TSH: 3.863 u[IU]/mL (ref 0.350–4.500)

## 2014-08-05 LAB — COMPREHENSIVE METABOLIC PANEL
ALBUMIN: 4 g/dL (ref 3.5–5.2)
ALK PHOS: 107 U/L (ref 39–117)
ALT: 45 U/L — ABNORMAL HIGH (ref 0–35)
AST: 67 U/L — ABNORMAL HIGH (ref 0–37)
BUN: 15 mg/dL (ref 6–23)
CO2: 25 mEq/L (ref 19–32)
Calcium: 9.6 mg/dL (ref 8.4–10.5)
Chloride: 103 mEq/L (ref 96–112)
Creat: 0.82 mg/dL (ref 0.50–1.10)
GLUCOSE: 96 mg/dL (ref 70–99)
POTASSIUM: 4.8 meq/L (ref 3.5–5.3)
Sodium: 137 mEq/L (ref 135–145)
Total Bilirubin: 0.3 mg/dL (ref 0.2–1.2)
Total Protein: 8 g/dL (ref 6.0–8.3)

## 2014-08-05 LAB — LIPID PANEL
CHOL/HDL RATIO: 3 ratio
CHOLESTEROL: 208 mg/dL — AB (ref 0–200)
HDL: 70 mg/dL (ref >=46)
LDL Cholesterol: 113 mg/dL — ABNORMAL HIGH (ref 0–99)
Triglycerides: 126 mg/dL (ref <150)
VLDL: 25 mg/dL (ref 0–40)

## 2014-08-05 MED ORDER — BUSPIRONE HCL 10 MG PO TABS: 10.0000 mg | ORAL_TABLET | Freq: Two times a day (BID) | ORAL | Status: DC

## 2014-08-05 MED ORDER — FLUTICASONE PROPIONATE 50 MCG/ACT NA SUSP: 2.0000 | Freq: Every day | NASAL | Status: DC

## 2014-08-05 MED ORDER — AZITHROMYCIN 250 MG PO TABS: ORAL_TABLET | ORAL | Status: DC

## 2014-08-05 NOTE — Assessment & Plan Note (Signed)
She continues to lose weight. This may be related to her smoking or her hepatitis C. He did have a chest x-ray 2 weeks ago which was negative she also had a CT scan limited on the abdomen there were no focal liver lesions. She was treated for pyelonephritis in the emergency room. ICU basilar fact she is in a pain clinic that there are no other substances involved. I want to check an HIV RPR along with her hepatitis levels again. Thyroid was also be checked

## 2014-08-05 NOTE — Patient Instructions (Addendum)
Start buspar twice a day  Decrease your xanax to 1/2 tablet twice a day  Continue current medication Keep appointment with your pain doctor and your psychiatrist Referral to GI for the hepatitis C  mucinex , nasal spray, antibiotics  F/U 4 months

## 2014-08-05 NOTE — Assessment & Plan Note (Signed)
Referral to gastroenterology again for management and treatment of hepatitis C

## 2014-08-05 NOTE — Assessment & Plan Note (Signed)
I discussed with her that I'm not prescribing her pain medication or her benzodiazepine's. She needs to be tapered off of one of the other. She agrees to come off of the nerve pills. She does not have an appointment with her psychiatrist until July of note however she was given 90 days to start this process of being tapered off in 60 days have R the past. I did write a letter to her pain clinically her know that I have recommended her decreasing 2.5 mg twice a day. I'll also restart her on BuSpar 10 mg twice a day until she can get in with her psychiatrist.

## 2014-08-05 NOTE — Progress Notes (Signed)
Patient ID: Brooke Woods, female   DOB: 08/24/1970, 44 y.o.   MRN: [id number]   Subjective:    Patient ID: Brooke Woods, female    DOB: [birth year], 44 y.o.   MRN: [id number]  Patient presents for F/U  patient here for follow-up. She was last seen in my clinic in July 2015. At that time, she was diagnosed with hepatitis C with a very high RNA level and quantitative she was referred to gastroenterology but she did not show up and has not had any follow-up on her hepatitis C. She has lost 8 pounds since the last visit. She is asking today for me to take over writing her chronic pain medication and her nerve pills. She states she is on oxycodone 10 mg by the pain clinic [facility] they actually tapered her down from 15 mg 4 times a day. She states that they have a 0 policy on benzodiazepine along with pain medicine. Her psychiatrist who she has been seen per report at [facility] and families has her on Xanax 1 mg twice a day along with Cymbalta and trazodone. Her records state BuSpar however she is not been on this medicine for the past few months. She states it is very difficult for her to get to multiple appointments. Note she has been into other pain clinics already. She also complains of sore throat bilateral ear pain mild cough for the past few weeks. She continues to smoke. She has not had any fever. Her brother had something similar for a couple of days.      Review Of Systems: per above   GEN- denies fatigue, fever, weight loss,weakness, recent illness HEENT- denies eye drainage, change in vision, +nasal discharge, CVS- denies chest pain, palpitations RESP- denies SOB, +cough, wheeze ABD- denies N/V, change in stools, abd pain GU- denies dysuria, hematuria, dribbling, incontinence MSK- denies joint pain, muscle aches, injury Neuro- denies headache, dizziness, syncope, seizure activity       Objective:    BP 128/78 mmHg  Pulse 70  Temp(Src) 98.1 F (36.7 C) (Oral)  Resp 16  Ht 5\' 7"   (1.702 m)  Wt 100 lb (45.36 kg)  BMI 15.66 kg/m2  LMP 08/02/2014 (Approximate) GEN- NAD, alert and oriented x3, hoarse voice HEENT- PERRL, EOMI, non injected sclera, pink conjunctiva, MMM, oropharynx mild injection, no exudates, nares clear, TM clear bilat no effusion Neck- Supple, no thyromegaly, no LAD CVS- RRR, no murmur RESP-CTAB Psych-  A little anxious appearing, not depressed, no SI, no hallucinations, well groomed EXT- No edema Pulses- Radial, 2+        Assessment & Plan:      Problem List Items Addressed This Visit    Weight loss    She continues to lose weight. This may be related to her smoking or her hepatitis C. He did have a chest x-ray 2 weeks ago which was negative she also had a CT scan limited on the abdomen there were no focal liver lesions. She was treated for pyelonephritis in the emergency room. ICU basilar fact she is in a pain clinic that there are no other substances involved. I want to check an HIV RPR along with her hepatitis levels again. Thyroid was also be checked      Relevant Orders   TSH   Tobacco use   Hyperlipidemia - Primary   Relevant Orders   Lipid panel   Hepatitis C reactive    Referral to gastroenterology again for management and treatment of  hepatitis C      Relevant Orders   HIV antibody   RPR   Hepatitis C RNA quantitative   Essential hypertension, benign    Blood pressure is well controlled off of medication      Relevant Orders   CBC with Differential/Platelet   Comprehensive metabolic panel   Depression   Relevant Medications   busPIRone (BUSPAR) 10 MG tablet   Anxiety state    I discussed with her that I'm not prescribing her pain medication or her benzodiazepine's. She needs to be tapered off of one of the other. She agrees to come off of the nerve pills. She does not have an appointment with her psychiatrist until July of note however she was given 90 days to start this process of being tapered off in 60 days have R  the past. I did write a letter to her pain clinically her know that I have recommended her decreasing 2.5 mg twice a day. I'll also restart her on BuSpar 10 mg twice a day until she can get in with her psychiatrist.      Relevant Medications   busPIRone (BUSPAR) 10 MG tablet    Other Visit Diagnoses    Acute URI        Treat with zpak, flonase, mucinex    Relevant Medications    azithromycin (ZITHROMAX) 250 MG tablet       Note: This dictation was prepared with Dragon dictation along with smaller phrase technology. Any transcriptional errors that result from this process are unintentional.

## 2014-08-05 NOTE — Assessment & Plan Note (Signed)
Blood pressure is well controlled off of medication

## 2014-08-06 LAB — HIV ANTIBODY (ROUTINE TESTING W REFLEX): HIV 1&2 Ab, 4th Generation: NONREACTIVE

## 2014-08-06 LAB — RPR

## 2014-08-07 LAB — HEPATITIS C RNA QUANTITATIVE
HCV QUANT: 6590047 [IU]/mL — AB (ref <15)
HCV Quantitative Log: 6.82 {Log} — ABNORMAL HIGH (ref <1.18)

## 2014-08-09 ENCOUNTER — Other Ambulatory Visit: Payer: Self-pay | Admitting: Family Medicine

## 2014-08-09 DIAGNOSIS — R768 Other specified abnormal immunological findings in serum: Secondary | ICD-10-CM

## 2014-08-16 ENCOUNTER — Encounter (INDEPENDENT_AMBULATORY_CARE_PROVIDER_SITE_OTHER): Payer: Self-pay | Admitting: *Deleted

## 2014-08-22 ENCOUNTER — Encounter: Payer: Self-pay | Admitting: Physician Assistant

## 2014-08-22 ENCOUNTER — Ambulatory Visit (INDEPENDENT_AMBULATORY_CARE_PROVIDER_SITE_OTHER): Payer: Medicaid Other | Admitting: Physician Assistant

## 2014-08-22 ENCOUNTER — Telehealth: Payer: Self-pay | Admitting: Family Medicine

## 2014-08-22 VITALS — BP 130/74 | HR 78 | Temp 97.7°F | Resp 16 | Ht 67.0 in | Wt 106.0 lb

## 2014-08-22 DIAGNOSIS — J988 Other specified respiratory disorders: Secondary | ICD-10-CM

## 2014-08-22 DIAGNOSIS — B9689 Other specified bacterial agents as the cause of diseases classified elsewhere: Secondary | ICD-10-CM

## 2014-08-22 DIAGNOSIS — J029 Acute pharyngitis, unspecified: Secondary | ICD-10-CM | POA: Diagnosis not present

## 2014-08-22 DIAGNOSIS — N3941 Urge incontinence: Secondary | ICD-10-CM

## 2014-08-22 MED ORDER — ALBUTEROL SULFATE HFA 108 (90 BASE) MCG/ACT IN AERS: 2.0000 | INHALATION_SPRAY | Freq: Four times a day (QID) | RESPIRATORY_TRACT | Status: DC | PRN

## 2014-08-22 MED ORDER — AMOXICILLIN-POT CLAVULANATE 875-125 MG PO TABS: 1.0000 | ORAL_TABLET | Freq: Two times a day (BID) | ORAL | Status: DC

## 2014-08-22 MED ORDER — TOLTERODINE TARTRATE ER 2 MG PO CP24: 2.0000 mg | ORAL_CAPSULE | Freq: Every day | ORAL | Status: DC

## 2014-08-22 NOTE — Telephone Encounter (Signed)
Patient is calling to say that she is not better even after taking antibiotic for some time, and also would like to talk to you regarding her detrol  Please call her at [phone]

## 2014-08-22 NOTE — Telephone Encounter (Signed)
Z- Pack prescribed on 08/05/2014 for URI.   No Detrol noted on medication list.   Call placed to patient to inquire. [place].

## 2014-08-22 NOTE — Progress Notes (Signed)
Patient ID: Brooke Woods MRN: [id number], DOB: [birth year], 44 y.o. Date of Encounter: @DATE @  Chief Complaint:  Chief Complaint  Patient presents with  . Illness    x3 weeks- URI- cough, ear pain, sore throat- has had ZPack with no relief  . Nocturnal Enuresis    states that she is having a lot of accidental bed wetting at night- has had this in the past and was placed on Detrol 4mg  which was effective    HPI: 44 y.o. year old white female  presents with above.   Reviewed her office visit note with Dr. Buelah Manis 08/05/14. At that visit Dr. Buelah Manis documented the patient was complaining of sore throat bilateral air pain mild cough for the past few weeks. Continued to smoke. Had no fever. At that visit she prescribed azithromycin Z-Pak. Patient states that she did take all the antibiotic as directed. However states that her symptoms never improved or resolved. Says that she has also been taking Mucinex. She is still hoarse still has tenderness in her neck and still has sore throat. Says that she is getting no mucus from the nose and does not have head or nasal congestion. Says that she is having no increased cough compared to her usual smoker's cough. Says that she is not getting out any phlegm. Says all of her symptoms seem to be in the throat neck region.  Says that the day she saw Dr. Buelah Manis for her visit on 08/05/2014 they had so many things to discuss that she forgot to mention this other issue. Says that she was prescribed Detrol when she was in her 44s and 44s for nocturnal enuresis. Says that the medication worked well for her and the symptoms had resolved and at some point she had just gone off the medicine and had done okay until recently. Says that she is taking care of her ill mother who she says Dr. Buelah Manis also sees as a patient. Stays with her at night. Says that some nights she urinates in her sleep. Says she is tired of having to wash her clothes and wash sheets etc. I then asked  her about symptoms during the day. She says that she does have significant urgency. Says that she will have had no sensation of needing to urinate and then all of a sudden has got to get to the bathroom quickly or she will urinate in her pants. Says this happens all the time. Just yesterday was in the grocery store and had to just leave her cart and run to the restroom. Says that she has actually had urine incontinence at these times in the past and has wet her pants and has to go wash laundry. States that she does not have any incontinence with coughing sneezing laughing.   Past Medical History  Diagnosis Date  . Depression   . Anxiety   . Chronic pain   . Stroke     Pt reported TIA 04/27/10  . Cancer     Pt states she has had cervical cancer  . Asthmatic bronchitis      Home Meds: Outpatient Prescriptions Prior to Visit  Medication Sig Dispense Refill  . ALPRAZolam (XANAX) 1 MG tablet Take 1 mg by mouth 2 (two) times daily.    Marland Kitchen azithromycin (ZITHROMAX) 250 MG tablet Take 2 tablets x1 day, then 1 tablet daily 4 days 6 tablet 0  . busPIRone (BUSPAR) 10 MG tablet Take 1 tablet (10 mg total) by mouth 2 (two)  times daily. 60 tablet 3  . DULoxetine (CYMBALTA) 60 MG capsule Take 60 mg by mouth daily.      . fluticasone (FLONASE) 50 MCG/ACT nasal spray Place 2 sprays into both nostrils daily. 16 g 6  . Oxycodone HCl 10 MG TABS Take 10 mg by mouth 4 (four) times daily as needed.    Marland Kitchen tiZANidine (ZANAFLEX) 4 MG tablet Take 4 mg by mouth every 6 (six) hours as needed for muscle spasms.    . traZODone (DESYREL) 100 MG tablet Take 100-200 mg by mouth at bedtime as needed for sleep.     Marland Kitchen albuterol (PROVENTIL HFA;VENTOLIN HFA) 108 (90 BASE) MCG/ACT inhaler Inhale 2 puffs into the lungs every 6 (six) hours as needed for wheezing. 1 Inhaler 3   No facility-administered medications prior to visit.    Allergies: No Known Allergies  History   Social History  . Marital Status: Single    Spouse  Name: N/A  . Number of Children: N/A  . Years of Education: N/A   Occupational History  . Not on file.   Social History Main Topics  . Smoking status: Current Every Day Smoker -- 1.00 packs/day    Types: Cigarettes  . Smokeless tobacco: Never Used  . Alcohol Use: No  . Drug Use: No  . Sexual Activity: Yes    Birth Control/ Protection: Surgical   Other Topics Concern  . Not on file   Social History Narrative    Family History  Problem Relation Age of Onset  . Hypertension Mother   . Hyperlipidemia Mother   . Depression Mother   . Cancer Father   . Hypertension Father   . Hyperlipidemia Father   . Hyperlipidemia Sister   . Hyperlipidemia Brother   . Depression Brother      Review of Systems:  See HPI for pertinent ROS. All other ROS negative.    Physical Exam: Blood pressure 130/74, pulse 78, temperature 97.7 F (36.5 C), temperature source Oral, resp. rate 16, height 5\' 7"  (1.702 m), weight 106 lb (48.081 kg), last menstrual period 07/25/2014., Body mass index is 16.6 kg/(m^2). General: Thin WF. Appears in no acute distress. Head: Normocephalic, atraumatic, eyes without discharge, sclera non-icteric, nares are without discharge.Right ear canal with cerumen. Left ear canal normal.  Left TM  without perforation, pearly grey and translucent with reflective cone of light. Oral cavity moist, posterior pharynx with mild erythema. No exudate, peritonsillar abscess.  Neck: Supple. No thyromegaly. She reports some tenderness with palpation of the anterior cervical lymph nodes bilaterally. I do not feel any enlarged nodes with palpation. Lungs: Clear bilaterally to auscultation without wheezes, rales, or rhonchi. Breathing is unlabored. Heart: RRR with S1 S2. No murmurs, rubs, or gallops. Musculoskeletal:  Strength and tone normal for age. Extremities/Skin: Warm and dry.  Neuro: Alert and oriented X 3. Moves all extremities spontaneously. Gait is normal. CNII-XII grossly in  tact. Psych:  Responds to questions appropriately with a normal affect.     ASSESSMENT AND PLAN:  44 y.o. year old female with  1. Acute pharyngitis, unspecified pharyngitis type - amoxicillin-clavulanate (AUGMENTIN) 875-125 MG per tablet; Take 1 tablet by mouth 2 (two) times daily.  Dispense: 20 tablet; Refill: 0  2. Bacterial respiratory infection - amoxicillin-clavulanate (AUGMENTIN) 875-125 MG per tablet; Take 1 tablet by mouth 2 (two) times daily.  Dispense: 20 tablet; Refill: 0  3. Urgency incontinence - tolterodine (DETROL LA) 2 MG 24 hr capsule; Take 1 capsule (2 mg total)  by mouth daily.  Dispense: 30 capsule; Refill: 11  Will treat pharyngitis and respiratory infection with Augmentin. She is to take this as directed and complete all of it. Follow up with [place] if symptoms do not completely resolve after completion of antibiotic.  Discussed with patient that the detrol comes in a higher dose and that there are other medications available to treat this problem. Told her if the Detrol causes any adverse effects or if she seems to need a higher dose, to follow-up with [place] as well.  Signed, [address] [place], [place], [facility] 08/22/2014 1:22 PM

## 2014-08-22 NOTE — Telephone Encounter (Signed)
Received return call from patient.   States that she continues to have hoarseness and cough after completing Z-pack.   Also states that she would like to discuss adding Detrol to medications for nocturnal enuresis.   Appointment scheduled with PA.

## 2014-08-23 ENCOUNTER — Telehealth: Payer: Self-pay | Admitting: Family Medicine

## 2014-08-23 NOTE — Telephone Encounter (Signed)
PA requested for [place] ER (Detrol LA)  Submitted through "CoverMyMeds" [id number]

## 2014-08-26 ENCOUNTER — Telehealth: Payer: Self-pay | Admitting: Family Medicine

## 2014-08-26 DIAGNOSIS — J029 Acute pharyngitis, unspecified: Secondary | ICD-10-CM

## 2014-08-26 DIAGNOSIS — R490 Dysphonia: Secondary | ICD-10-CM

## 2014-08-26 MED ORDER — SOLIFENACIN SUCCINATE 5 MG PO TABS: 5.0000 mg | ORAL_TABLET | Freq: Every day | ORAL | Status: DC

## 2014-08-26 NOTE — Telephone Encounter (Signed)
Referral initiated and pt made aware

## 2014-08-26 NOTE — Telephone Encounter (Signed)
Still sick for last two weeks.  After two antibiotics still with sore throat and ear pain.  Wants referral to ENT in [place] or [place].

## 2014-08-26 NOTE — Telephone Encounter (Signed)
Detrol LA denied.  Must have tried or failed two of the following.  Myrbetriq, Oxybutynin ER or Vesicare.  Please advise?

## 2014-08-26 NOTE — Telephone Encounter (Signed)
Vesicare 5mg  1 po QD # 30 + 3

## 2014-08-26 NOTE — Telephone Encounter (Signed)
Okay to send referral-  Hoarse voice, pharyngitis

## 2014-08-26 NOTE — Telephone Encounter (Signed)
Pt made aware of new RX

## 2014-09-06 ENCOUNTER — Ambulatory Visit (INDEPENDENT_AMBULATORY_CARE_PROVIDER_SITE_OTHER): Payer: Medicare Other | Admitting: Internal Medicine

## 2014-09-06 ENCOUNTER — Encounter (INDEPENDENT_AMBULATORY_CARE_PROVIDER_SITE_OTHER): Payer: Medicare Other | Admitting: Internal Medicine

## 2014-09-19 ENCOUNTER — Encounter (INDEPENDENT_AMBULATORY_CARE_PROVIDER_SITE_OTHER): Payer: Self-pay | Admitting: *Deleted

## 2014-10-10 ENCOUNTER — Ambulatory Visit (INDEPENDENT_AMBULATORY_CARE_PROVIDER_SITE_OTHER): Payer: Medicare Other | Admitting: Otolaryngology

## 2014-10-31 ENCOUNTER — Encounter (INDEPENDENT_AMBULATORY_CARE_PROVIDER_SITE_OTHER): Payer: Medicare Other | Admitting: Internal Medicine

## 2014-12-06 ENCOUNTER — Ambulatory Visit: Payer: Medicaid Other | Admitting: Family Medicine

## 2015-04-18 ENCOUNTER — Emergency Department (HOSPITAL_COMMUNITY)
Admission: EM | Admit: 2015-04-18 | Discharge: 2015-04-18 | Disposition: A | Discharge status: Home / Self Care | Payer: Medicare Other

## 2015-04-18 NOTE — ED Notes (Signed)
Called x 1 for triage no answer.

## 2015-04-23 ENCOUNTER — Ambulatory Visit (INDEPENDENT_AMBULATORY_CARE_PROVIDER_SITE_OTHER): Payer: Medicare Other | Admitting: Family Medicine

## 2015-04-23 ENCOUNTER — Other Ambulatory Visit: Payer: Self-pay | Admitting: *Deleted

## 2015-04-23 ENCOUNTER — Encounter: Payer: Self-pay | Admitting: Family Medicine

## 2015-04-23 VITALS — BP 134/86 | HR 84 | Temp 98.4°F | Resp 16 | Wt 111.0 lb

## 2015-04-23 DIAGNOSIS — J209 Acute bronchitis, unspecified: Secondary | ICD-10-CM | POA: Diagnosis not present

## 2015-04-23 DIAGNOSIS — Z720 Tobacco use: Secondary | ICD-10-CM | POA: Diagnosis not present

## 2015-04-23 MED ORDER — PREDNISONE 20 MG PO TABS: ORAL_TABLET | ORAL | Status: DC

## 2015-04-23 MED ORDER — ALBUTEROL SULFATE HFA 108 (90 BASE) MCG/ACT IN AERS: 2.0000 | INHALATION_SPRAY | Freq: Four times a day (QID) | RESPIRATORY_TRACT | Status: DC | PRN

## 2015-04-23 MED ORDER — AZITHROMYCIN 250 MG PO TABS: ORAL_TABLET | ORAL | Status: DC

## 2015-04-23 NOTE — Progress Notes (Signed)
Patient ID: Brooke Woods, female   DOB: 05-10-1970, 45 y.o.   MRN: [id number]   Subjective:    Patient ID: Brooke Woods, female    DOB: Oct 01, 1970, 45 y.o.   MRN: [id number]  Patient presents for Cough Patient was here with her mother. She states for the past 2 weeks she's had cough with wheeze occasional shortness of breath. She is a smoker. She is not taking any regular medications. Subjective fever week ago with body aches. She has been using her albuterol inhaler which helps. She also tried over-the-counter cough syrup. Positive sick contact    Review Of Systems:  GEN- denies fatigue,+ fever, weight loss,weakness, recent illness HEENT- denies eye drainage, change in vision,+ nasal discharge, CVS- denies chest pain, palpitations RESP- denies SOB,+ cough, +wheeze ABD- denies N/V, change in stools, abd pain GU- denies dysuria, hematuria, dribbling, incontinence MSK- denies joint pain, muscle aches, injury Neuro- denies headache, dizziness, syncope, seizure activity       Objective:    BP 134/86 mmHg  Pulse 84  Temp(Src) 98.4 F (36.9 C)  Resp 16  Wt 111 lb (50.349 kg)  SpO2 98%  GEN- NAD, alert and oriented x3 HEENT- PERRL, EOMI, non injected sclera, pink conjunctiva, MMM, oropharynx clear, TM clear bilat no effusion,  + No maxillary sinus tenderness, nares clear Neck- Supple, no LAD CVS- RRR, no murmur RESP-scattered wheeze, mild rhonchi, normal WOB, no retractions Pulses- Radial 2+          Assessment & Plan:      Problem List Items Addressed This Visit    None    Visit Diagnoses    Acute bronchitis, unspecified organism    -  Primary    URi now with bronchitis in smoker, given zpak, prednisone, albuterol inhaler       Note: This dictation was prepared with Dragon dictation along with smaller phrase technology. Any transcriptional errors that result from this process are unintentional.

## 2015-04-23 NOTE — Telephone Encounter (Signed)
Pt called stating was just in office and was prescribed azithromycin, went to pharmacy and stated never called in. I checked the list, was not called in, sent azithromycin to pharmacy

## 2015-04-23 NOTE — Patient Instructions (Signed)
Use inhaler Prednisone and antibiotics F/U as needed

## 2015-04-24 ENCOUNTER — Ambulatory Visit: Payer: Self-pay | Admitting: Family Medicine

## 2016-06-19 ENCOUNTER — Emergency Department (HOSPITAL_COMMUNITY)
Admission: EM | Admit: 2016-06-19 | Discharge: 2016-06-19 | Disposition: A | Discharge status: Home / Self Care | Payer: Medicare Other | Attending: Emergency Medicine | Admitting: Emergency Medicine

## 2016-06-19 ENCOUNTER — Encounter (HOSPITAL_COMMUNITY): Payer: Self-pay | Admitting: Emergency Medicine

## 2016-06-19 DIAGNOSIS — F17210 Nicotine dependence, cigarettes, uncomplicated: Secondary | ICD-10-CM | POA: Diagnosis not present

## 2016-06-19 DIAGNOSIS — J45909 Unspecified asthma, uncomplicated: Secondary | ICD-10-CM | POA: Insufficient documentation

## 2016-06-19 DIAGNOSIS — K029 Dental caries, unspecified: Secondary | ICD-10-CM | POA: Diagnosis not present

## 2016-06-19 DIAGNOSIS — I10 Essential (primary) hypertension: Secondary | ICD-10-CM | POA: Diagnosis not present

## 2016-06-19 DIAGNOSIS — Z79899 Other long term (current) drug therapy: Secondary | ICD-10-CM | POA: Insufficient documentation

## 2016-06-19 DIAGNOSIS — K0889 Other specified disorders of teeth and supporting structures: Secondary | ICD-10-CM | POA: Diagnosis present

## 2016-06-19 DIAGNOSIS — Z8541 Personal history of malignant neoplasm of cervix uteri: Secondary | ICD-10-CM | POA: Insufficient documentation

## 2016-06-19 MED ORDER — CHLORHEXIDINE GLUCONATE 0.12 % MT SOLN: 15.0000 mL | Freq: Two times a day (BID) | OROMUCOSAL | 0 refills | Status: DC

## 2016-06-19 MED ORDER — OXYCODONE HCL 5 MG PO TABS
5.0000 mg | ORAL_TABLET | Freq: Once | ORAL | Status: AC
  Administered 2016-06-19: 5 mg via ORAL
  Filled 2016-06-19: qty 1

## 2016-06-19 MED ORDER — AMOXICILLIN 500 MG PO CAPS: 500.0000 mg | ORAL_CAPSULE | Freq: Three times a day (TID) | ORAL | 0 refills | Status: DC

## 2016-06-19 NOTE — ED Provider Notes (Signed)
[facility] DEPT Provider Note   CSN: [id number] Arrival date & time: 06/19/16  1113     History   Chief Complaint Chief Complaint  Patient presents with  . Dental Pain    HPI Brooke Woods is a 46 y.o. female who presents with dental pain that began this morning. Patient states she has a history of dental issues but has not been seen by dentist in over a year. She states that she took ibuprofen with no relief of pain. She has been able to tolerate her secretions and tolerate liquids. She reports pain is worsened by eating and reports decreased by PO secondary to pain. Patient denies any fever, nausea/vomiting, difficulty breathing, trouble swallowing. Patient states she is a smoker and smokes about half a pack of cigarettes a day.   The history is provided by the patient.    Past Medical History:  Diagnosis Date  . Anxiety   . Asthmatic bronchitis   . Cancer ([place])    Pt states she has had cervical cancer  . Chronic pain   . Depression   . Stroke [facility])    Pt reported TIA 04/27/10    Patient Active Problem List   Diagnosis Date Noted  . Hepatitis C reactive 09/03/2013  . Asthma with bronchitis 09/03/2013  . Elevated LFTs 09/03/2013  . Leukocytosis, unspecified 09/20/2012  . Essential hypertension, benign 10/05/2011  . Hyperlipidemia 10/05/2011  . Poor dentition 10/05/2011  . Weight loss 10/05/2011  . Depression 10/05/2011  . Tobacco use 10/05/2011  . Chronic pain 08/17/2010  . Anxiety state 08/17/2010    Past Surgical History:  Procedure Laterality Date  . CHOLECYSTECTOMY    . FOOT SURGERY    . KNEE SURGERY    . OVARY SURGERY    . TUBAL LIGATION      OB History    Gravida Para Term Preterm AB Living   2 2 2     2    SAB TAB Ectopic Multiple Live Births                   Home Medications    Prior to Admission medications   Medication Sig Start Date End Date Taking? Authorizing Provider  albuterol (PROVENTIL HFA;VENTOLIN HFA) 108 (90 Base)  MCG/ACT inhaler Inhale 2 puffs into the lungs every 6 (six) hours as needed for wheezing. 04/23/15   [place], Modena Nunnery, MD  amoxicillin (AMOXIL) 500 MG capsule Take 1 capsule (500 mg total) by mouth 3 (three) times daily. 06/19/16   Volanda Napoleon, PA-C  azithromycin (ZITHROMAX) 250 MG tablet Take 2 tablets x1 day, then 1 tablet daily 4 days 04/23/15   Alycia Rossetti, MD  chlorhexidine (PERIDEX) 0.12 % solution Use as directed 15 mLs in the mouth or throat 2 (two) times daily. 06/19/16   Volanda Napoleon, PA-C  predniSONE (DELTASONE) 20 MG tablet Take 40mg  po daily x 5 days 04/23/15   Alycia Rossetti, MD    Family History Family History  Problem Relation Age of Onset  . Hypertension Mother   . Hyperlipidemia Mother   . Depression Mother   . Cancer Father   . Hypertension Father   . Hyperlipidemia Father   . Hyperlipidemia Sister   . Hyperlipidemia Brother   . Depression Brother     Social History Social History  Substance Use Topics  . Smoking status: Current Every Day Smoker    Packs/day: 1.00    Types: Cigarettes  . Smokeless tobacco: Never  Used  . Alcohol use No     Allergies   Patient has no known allergies.   Review of Systems Review of Systems  Constitutional: Negative for appetite change and fever.  HENT: Positive for dental problem. Negative for drooling and trouble swallowing.   Respiratory: Negative for shortness of breath.   Gastrointestinal: Negative for abdominal pain.  All other systems reviewed and are negative.    Physical Exam Updated Vital Signs BP (!) 165/95 (BP Location: Right Arm)   Pulse 67   Temp 99.7 F (37.6 C) (Oral)   Resp 18   Ht 5\' 7"  (1.702 m)   Wt 49.9 kg   LMP 05/26/2016   SpO2 99%   BMI 17.23 kg/m   Physical Exam  Constitutional: She appears well-developed and well-nourished.  HENT:  Head: Normocephalic and atraumatic.  Mouth/Throat: Uvula is midline, oropharynx is clear and moist and mucous membranes are normal. No  trismus in the jaw. Abnormal dentition. Dental caries present. No dental abscesses.  Multiple missing teeth. Multiple dental caries and dental decay. No gingival swelling. No evidence of abscess. No trismus. Elevation/depression in lateral movement of mandible in place without any difficulty. No facial swelling. No angioedema of lips or tongue.  Eyes: Conjunctivae and EOM are normal. Right eye exhibits no discharge. Left eye exhibits no discharge. No scleral icterus.  Pulmonary/Chest: Effort normal.  Musculoskeletal: She exhibits no deformity.  Neurological: She is alert.  Skin: Skin is warm and dry.  Psychiatric: She has a normal mood and affect. Her speech is normal and behavior is normal.     ED Treatments / Results  Labs (all labs ordered are listed, but only abnormal results are displayed) Labs Reviewed - No data to display  EKG  EKG Interpretation None       Radiology No results found.  Procedures Procedures (including critical care time)  Medications Ordered in ED Medications  oxyCODONE (Oxy IR/ROXICODONE) immediate release tablet 5 mg (5 mg Oral Given 06/19/16 1245)     Initial Impression / Assessment and Plan / ED Course  I have reviewed the triage vital signs and the nursing notes.  Pertinent labs & imaging results that were available during my care of the patient were reviewed by me and considered in my medical decision making (see chart for details).     46 yo F who presents with dental pain that began this morning. Known history of dental issues. Has not been evaluated by a dentist and over a year. No history of fevers, trouble swallowing, difficulty breathing. Physical exam shows multiple missing teeth and multiple evidence of dental caries and dental decay. No signs of abscess or acute dental emergency that would need emergent care. No signs of acute dental emergency. Will need outpatient dental follow-up for evaluation of multiple issues. Will plan to cover  patient with antibiotics for potential infection and an provide patient with outpatient dental referrals. Discussed plan with patient. She is requesting a short course of pain medications to help with pain control until she is able to be seen by dentist. Explained that we do not treat dental pain with narcotics. Instructed her that she can take over-the-counter medications as needed for pain relief. Patient looked up on [place] controlled substance website shows no recent pain control but occasions prescribed. Will give her one dose of oxycodone here in the emergency department. Discussed with patient. She is again asking for short course of pain medication. Explained that we will give her one dose here  in the department will not prescribe any for home. Started her to follow-up with referred dentist. Strict return precautions discussed. Patient expresses understanding and agreement to plan.   Final Clinical Impressions(s) / ED Diagnoses   Final diagnoses:  Pain, dental    New Prescriptions Discharge Medication List as of 06/19/2016 12:43 PM    START taking these medications   Details  amoxicillin (AMOXIL) 500 MG capsule Take 1 capsule (500 mg total) by mouth 3 (three) times daily., Starting Sat 06/19/2016, Print    chlorhexidine (PERIDEX) 0.12 % solution Use as directed 15 mLs in the mouth or throat 2 (two) times daily., Starting Sat 06/19/2016, Print         Desma Mcgregor 06/19/16 1335    Mesner, Corene Cornea, MD 06/19/16 1511

## 2016-06-19 NOTE — Discharge Instructions (Signed)
Take antibiotics as directed.  Use mouthwash as instructed.  Follow-up with the dentist for further evaluation. You can either call your dentist or follow up with one of the ones provided.  Return to the emergency department for any worsening pain, fever, difficulty breathing, difficulty swallowing or any other worsening or concerning symptoms.

## 2016-06-19 NOTE — ED Triage Notes (Signed)
PT c/o left upper dental pain x1 day.

## 2016-10-27 ENCOUNTER — Telehealth: Payer: Self-pay | Admitting: *Deleted

## 2016-10-27 NOTE — Telephone Encounter (Signed)
Received call from patient.   Requested referral to Dr. Mirna Mires with [facility] [phone]~ teleohone/ [phone]~ fax for Chronic Pain Syndrome. Dx: [email or web address]  MD please advise.

## 2016-10-27 NOTE — Telephone Encounter (Signed)
She needs OV for this

## 2016-10-28 NOTE — Telephone Encounter (Signed)
Call placed to patient and patient made aware.   Appointment scheduled.  

## 2016-11-01 ENCOUNTER — Ambulatory Visit: Payer: Self-pay | Admitting: Family Medicine

## 2016-11-02 ENCOUNTER — Encounter: Payer: Self-pay | Admitting: Family Medicine

## 2016-11-02 ENCOUNTER — Ambulatory Visit (INDEPENDENT_AMBULATORY_CARE_PROVIDER_SITE_OTHER): Payer: Medicare Other | Admitting: Family Medicine

## 2016-11-02 VITALS — BP 118/74 | HR 86 | Temp 98.5°F | Resp 14 | Ht 67.0 in | Wt 105.8 lb

## 2016-11-02 DIAGNOSIS — G4700 Insomnia, unspecified: Secondary | ICD-10-CM | POA: Insufficient documentation

## 2016-11-02 DIAGNOSIS — M545 Low back pain, unspecified: Secondary | ICD-10-CM

## 2016-11-02 DIAGNOSIS — J45909 Unspecified asthma, uncomplicated: Secondary | ICD-10-CM

## 2016-11-02 DIAGNOSIS — Z9889 Other specified postprocedural states: Secondary | ICD-10-CM | POA: Diagnosis not present

## 2016-11-02 DIAGNOSIS — G8929 Other chronic pain: Secondary | ICD-10-CM

## 2016-11-02 DIAGNOSIS — Z720 Tobacco use: Secondary | ICD-10-CM | POA: Diagnosis not present

## 2016-11-02 DIAGNOSIS — R768 Other specified abnormal immunological findings in serum: Secondary | ICD-10-CM

## 2016-11-02 DIAGNOSIS — F331 Major depressive disorder, recurrent, moderate: Secondary | ICD-10-CM | POA: Diagnosis not present

## 2016-11-02 DIAGNOSIS — G894 Chronic pain syndrome: Secondary | ICD-10-CM

## 2016-11-02 DIAGNOSIS — F5101 Primary insomnia: Secondary | ICD-10-CM

## 2016-11-02 MED ORDER — TRAZODONE HCL 50 MG PO TABS: 25.0000 mg | ORAL_TABLET | Freq: Every evening | ORAL | 3 refills | Status: DC | PRN

## 2016-11-02 MED ORDER — ALBUTEROL SULFATE HFA 108 (90 BASE) MCG/ACT IN AERS: 2.0000 | INHALATION_SPRAY | RESPIRATORY_TRACT | 3 refills | Status: DC | PRN

## 2016-11-02 MED ORDER — BUDESONIDE-FORMOTEROL FUMARATE 80-4.5 MCG/ACT IN AERO: 2.0000 | INHALATION_SPRAY | Freq: Two times a day (BID) | RESPIRATORY_TRACT | 3 refills | Status: DC

## 2016-11-02 NOTE — Patient Instructions (Signed)
Try the symbicort Referral to pain management We will call with lab results  Start trazodone at bedtime F/U 2-3 months for Physical

## 2016-11-02 NOTE — Progress Notes (Signed)
   Subjective:    Patient ID: Brooke Woods, female    DOB: [birth year], 46 y.o.   MRN: [id number]  Patient presents for Referral (pain management)  pain mangement- she has not seen pain management in a few years  Has seen [facility] and Dr. Francesco Runner in the past Needs referral  Has history of chronic foot pain, has had multiple foot surgery, right knee surgery,chronic back pain.   No longer smoking marjuana    Has inhaler, lungs feel tigh, coughing a lot- albuterol  Hepatitis C positiveAnd never received any treatment. It was last checked 2 years ago  Review Of Systems:  GEN- denies fatigue, fever, weight loss,weakness, recent illness HEENT- denies eye drainage, change in vision, nasal discharge, CVS- denies chest pain, palpitations RESP- denies SOB, cough, wheeze ABD- denies N/V, change in stools, abd pain GU- denies dysuria, hematuria, dribbling, incontinence MSK- + joint pain, muscle aches, injury Neuro- denies headache, dizziness, syncope, seizure activity       Objective:    BP 118/74   Pulse 86   Temp 98.5 F (36.9 C) (Oral)   Resp 14   Ht 5\' 7"  (1.702 m)   Wt 105 lb 12.8 oz (48 kg)   SpO2 95%   BMI 16.57 kg/m  GEN- NAD, alert and oriented x3,think female  HEENT- PERRL, EOMI, non injected sclera, pink conjunctiva, MMM, oropharynx clear,edentoulous Neck- Supple, no thyromegaly CVS- RRR, no murmur RESP-CTAB ABD-NABS,soft,NT,ND Psych- normal affect and mood,not anxious, pleasant  EXT- No edema Pulses- Radial 2+        Assessment & Plan:      Problem List Items Addressed This Visit      Unprioritized   Tobacco use   Depression   Relevant Medications   traZODone (DESYREL) 50 MG tablet   Insomnia    She's been on trazodone in the past which is last prescribed by her psychiatrist. Burnis Medin restart trazodone she understands that she cannot be on any benzodiazepines which she is seeking chronic pain treatment again      Hepatitis C antibody test positive   Recheck hepatitis C titers she states that she is willing to get treatment      Relevant Orders   CBC with Differential/Platelet (Completed)   Comprehensive metabolic panel (Completed)   HCV RNA quant rflx ultra or genotyp   Chronic pain - Primary    Chronic pain due to multiple surgeries but knee chronic back pain. She has been in pain management many times in the past. She wants to reinstate and get back with pain meds. Not been doing any new workup of these are all very old injuries and chronic pain more for her to Dr. Mirna Mires who she was seen at her last pain clinic a couple years ago      Relevant Medications   traZODone (DESYREL) 50 MG tablet   Asthma with bronchitis    Scott reports of tobacco cessation. She does use her albuterol fairly regular. Prominent try her on Symbicort and gave her samples today of the 80 mg dose      Relevant Medications   albuterol (PROVENTIL HFA;VENTOLIN HFA) 108 (90 Base) MCG/ACT inhaler   budesonide-formoterol (SYMBICORT) 80-4.5 MCG/ACT inhaler      Note: This dictation was prepared with Dragon dictation along with smaller phrase technology. Any transcriptional errors that result from this process are unintentional.

## 2016-11-03 ENCOUNTER — Encounter: Payer: Self-pay | Admitting: Family Medicine

## 2016-11-03 NOTE — Assessment & Plan Note (Signed)
She's been on trazodone in the past which is last prescribed by her psychiatrist. Brooke Woods restart trazodone she understands that she cannot be on any benzodiazepines which she is seeking chronic pain treatment again

## 2016-11-03 NOTE — Assessment & Plan Note (Signed)
Chronic pain due to multiple surgeries but knee chronic back pain. She has been in pain management many times in the past. She wants to reinstate and get back with pain meds. Not been doing any new workup of these are all very old injuries and chronic pain more for her to Dr. Mirna Mires who she was seen at her last pain clinic a couple years ago

## 2016-11-03 NOTE — Assessment & Plan Note (Signed)
Recheck hepatitis C titers she states that she is willing to get treatment

## 2016-11-03 NOTE — Assessment & Plan Note (Signed)
Scott reports of tobacco cessation. She does use her albuterol fairly regular. Prominent try her on Symbicort and gave her samples today of the 80 mg dose

## 2016-11-04 ENCOUNTER — Encounter: Payer: Self-pay | Admitting: Family Medicine

## 2016-11-08 ENCOUNTER — Other Ambulatory Visit: Payer: Self-pay | Admitting: *Deleted

## 2016-11-08 DIAGNOSIS — R768 Other specified abnormal immunological findings in serum: Secondary | ICD-10-CM

## 2016-11-09 ENCOUNTER — Encounter: Payer: Self-pay | Admitting: Gastroenterology

## 2016-11-09 LAB — COMPREHENSIVE METABOLIC PANEL
AG RATIO: 1.2 (calc) (ref 1.0–2.5)
ALT: 150 U/L — AB (ref 6–29)
AST: 139 U/L — ABNORMAL HIGH (ref 10–35)
Albumin: 4.1 g/dL (ref 3.6–5.1)
Alkaline phosphatase (APISO): 90 U/L (ref 33–115)
BUN: 9 mg/dL (ref 7–25)
CO2: 29 mmol/L (ref 20–32)
CREATININE: 0.94 mg/dL (ref 0.50–1.10)
Calcium: 9.3 mg/dL (ref 8.6–10.2)
Chloride: 103 mmol/L (ref 98–110)
GLOBULIN: 3.3 g/dL (ref 1.9–3.7)
GLUCOSE: 80 mg/dL (ref 65–99)
Potassium: 4.7 mmol/L (ref 3.5–5.3)
SODIUM: 139 mmol/L (ref 135–146)
TOTAL PROTEIN: 7.4 g/dL (ref 6.1–8.1)
Total Bilirubin: 0.3 mg/dL (ref 0.2–1.2)

## 2016-11-09 LAB — CBC WITH DIFFERENTIAL/PLATELET
BASOS ABS: 53 {cells}/uL (ref 0–200)
BASOS PCT: 0.6 %
EOS PCT: 3.1 %
Eosinophils Absolute: 273 cells/uL (ref 15–500)
HCT: 43.3 % (ref 35.0–45.0)
HEMOGLOBIN: 14.8 g/dL (ref 11.7–15.5)
Lymphs Abs: 3274 cells/uL (ref 850–3900)
MCH: 31.3 pg (ref 27.0–33.0)
MCHC: 34.2 g/dL (ref 32.0–36.0)
MCV: 91.5 fL (ref 80.0–100.0)
MPV: 12 fL (ref 7.5–12.5)
Monocytes Relative: 8.6 %
NEUTROS ABS: 4444 {cells}/uL (ref 1500–7800)
Neutrophils Relative %: 50.5 %
Platelets: 194 10*3/uL (ref 140–400)
RBC: 4.73 10*6/uL (ref 3.80–5.10)
RDW: 12.9 % (ref 11.0–15.0)
Total Lymphocyte: 37.2 %
WBC mixed population: 757 cells/uL (ref 200–950)
WBC: 8.8 10*3/uL (ref 3.8–10.8)

## 2016-11-09 LAB — HCV RNA,QN PCR RFLX GENO, LIPABAD
HCV RNA, PCR, QN (LOG): 6.54 {Log_IU}/mL — AB
HCV RNA, PCR, QN: 3500000 [IU]/mL — AB

## 2016-11-09 LAB — HEPATITIS C GENOTYPE

## 2016-12-09 ENCOUNTER — Encounter: Payer: Self-pay | Admitting: Gastroenterology

## 2016-12-09 ENCOUNTER — Ambulatory Visit: Payer: Medicare Other | Admitting: Gastroenterology

## 2016-12-09 ENCOUNTER — Telehealth: Payer: Self-pay | Admitting: Gastroenterology

## 2016-12-09 NOTE — Telephone Encounter (Signed)
PATIENT WAS A NO SHOW AND LETTER SENT  °

## 2016-12-15 ENCOUNTER — Encounter: Payer: Self-pay | Admitting: Family Medicine

## 2017-01-12 ENCOUNTER — Encounter: Payer: Self-pay | Admitting: Family Medicine

## 2017-02-23 ENCOUNTER — Encounter: Payer: Self-pay | Admitting: Family Medicine

## 2017-03-23 ENCOUNTER — Encounter: Payer: Self-pay | Admitting: Family Medicine

## 2017-04-20 ENCOUNTER — Encounter: Payer: Self-pay | Admitting: Family Medicine

## 2017-05-18 ENCOUNTER — Encounter: Payer: Self-pay | Admitting: Family Medicine

## 2017-06-27 ENCOUNTER — Encounter: Payer: Self-pay | Admitting: Family Medicine

## 2017-06-28 ENCOUNTER — Ambulatory Visit (INDEPENDENT_AMBULATORY_CARE_PROVIDER_SITE_OTHER): Payer: Medicare Other | Admitting: Family Medicine

## 2017-06-28 ENCOUNTER — Encounter: Payer: Self-pay | Admitting: Family Medicine

## 2017-06-28 VITALS — BP 122/80 | HR 68 | Temp 97.6°F | Resp 14 | Ht 67.0 in | Wt 107.0 lb

## 2017-06-28 DIAGNOSIS — F5101 Primary insomnia: Secondary | ICD-10-CM

## 2017-06-28 DIAGNOSIS — J45909 Unspecified asthma, uncomplicated: Secondary | ICD-10-CM

## 2017-06-28 DIAGNOSIS — G894 Chronic pain syndrome: Secondary | ICD-10-CM | POA: Diagnosis not present

## 2017-06-28 DIAGNOSIS — R768 Other specified abnormal immunological findings in serum: Secondary | ICD-10-CM

## 2017-06-28 DIAGNOSIS — Z720 Tobacco use: Secondary | ICD-10-CM

## 2017-06-28 MED ORDER — BUDESONIDE-FORMOTEROL FUMARATE 80-4.5 MCG/ACT IN AERO: 2.0000 | INHALATION_SPRAY | Freq: Two times a day (BID) | RESPIRATORY_TRACT | 6 refills | Status: DC

## 2017-06-28 MED ORDER — TRAZODONE HCL 50 MG PO TABS: 25.0000 mg | ORAL_TABLET | Freq: Every evening | ORAL | 6 refills | Status: DC | PRN

## 2017-06-28 MED ORDER — ALBUTEROL SULFATE HFA 108 (90 BASE) MCG/ACT IN AERS: 2.0000 | INHALATION_SPRAY | RESPIRATORY_TRACT | 3 refills | Status: DC | PRN

## 2017-06-28 NOTE — Patient Instructions (Addendum)
Release records- [facility] [place]  Referral to [facility]  Schedule Physical for December

## 2017-06-28 NOTE — Progress Notes (Signed)
   Subjective:    Patient ID: Brooke Woods, female    DOB: 29-Jan-1971, 47 y.o.   MRN: [id number]  Patient presents for Pain Clinic (states that she would like to go to [facility] since her current MD has moved to [place]) and GI Referral (states that she would like to go to [facility] as they have GI MD there)  Pt here for referrals  She has known Hep C has not had treatment, multiple referrals in the past, last done in November, which she did not show up for Wants to referred to [facility] for GI   Chronic pain- referred to pain clinic of her specific choice- Dr. Mirna Mires , but his clinic has morved to [place] which is now 1 hour away [employer] on [address]  Taking oxycodone 10mg  fiver times a day   COPD- taking symbicort, continues to smoke 1/2 PPD     Review Of Systems:  GEN- denies fatigue, fever, weight loss,weakness, recent illness HEENT- denies eye drainage, change in vision, nasal discharge, CVS- denies chest pain, palpitations RESP- denies SOB, cough, wheeze ABD- denies N/V, change in stools, abd pain GU- denies dysuria, hematuria, dribbling, incontinence MSK- denies joint pain, muscle aches, injury Neuro- denies headache, dizziness, syncope, seizure activity       Objective:    BP 122/80   Pulse 68   Temp 97.6 F (36.4 C) (Oral)   Resp 14   Ht 5\' 7"  (1.702 m)   Wt 107 lb (48.5 kg)   SpO2 97%   BMI 16.76 kg/m  GEN- NAD, alert and oriented x3 HEENT- PERRL, EOMI, non injected sclera, pink conjunctiva, MMM, oropharynx clear CVS- RRR, no murmur RESP-CTAB ABD-NABS,soft,NT,ND EXT- No edema Pulses- Radial 2+        Assessment & Plan:      Problem List Items Addressed This Visit      Unprioritized   Tobacco use   Insomnia    Continued on trazadone      Hepatitis C antibody test positive - Primary    Untreated, discussed with her we have made multiple referrals for her treatment and need for follow through on her behalf      Relevant Orders   Ambulatory referral to Gastroenterology   Chronic pain    Referral to new pain clinic, obtain records from previous clinic as well She has oxycodone per report       Relevant Medications   traZODone (DESYREL) 50 MG tablet   Other Relevant Orders   Ambulatory referral to Pain Clinic   Asthma with bronchitis    Refilled symbicort, continues to smoke, not ready to quit       Relevant Medications   budesonide-formoterol (SYMBICORT) 80-4.5 MCG/ACT inhaler   albuterol (PROVENTIL HFA;VENTOLIN HFA) 108 (90 Base) MCG/ACT inhaler      Note: This dictation was prepared with Dragon dictation along with smaller phrase technology. Any transcriptional errors that result from this process are unintentional.

## 2017-06-29 ENCOUNTER — Encounter: Payer: Self-pay | Admitting: Family Medicine

## 2017-06-29 NOTE — Assessment & Plan Note (Signed)
Refilled symbicort, continues to smoke, not ready to quit

## 2017-06-29 NOTE — Assessment & Plan Note (Signed)
Referral to new pain clinic, obtain records from previous clinic as well She has oxycodone per report

## 2017-06-29 NOTE — Assessment & Plan Note (Signed)
Continued on trazadone

## 2017-06-29 NOTE — Assessment & Plan Note (Signed)
Untreated, discussed with her we have made multiple referrals for her treatment and need for follow through on her behalf

## 2018-10-27 ENCOUNTER — Ambulatory Visit: Payer: Self-pay | Admitting: Family Medicine

## 2020-08-20 ENCOUNTER — Other Ambulatory Visit (HOSPITAL_COMMUNITY): Payer: Self-pay | Admitting: Family Medicine

## 2020-08-20 DIAGNOSIS — M79605 Pain in left leg: Secondary | ICD-10-CM

## 2020-08-20 DIAGNOSIS — I83892 Varicose veins of left lower extremities with other complications: Secondary | ICD-10-CM

## 2020-08-21 ENCOUNTER — Ambulatory Visit (HOSPITAL_COMMUNITY)
Admission: RE | Admit: 2020-08-21 | Discharge: 2020-08-21 | Disposition: A | Discharge status: Home / Self Care | Payer: Medicare Other | Source: Ambulatory Visit | Attending: Family Medicine | Admitting: Family Medicine

## 2020-08-21 ENCOUNTER — Other Ambulatory Visit: Payer: Self-pay

## 2020-08-21 DIAGNOSIS — M79605 Pain in left leg: Secondary | ICD-10-CM | POA: Diagnosis not present

## 2020-08-21 DIAGNOSIS — I83892 Varicose veins of left lower extremities with other complications: Secondary | ICD-10-CM

## 2020-08-26 ENCOUNTER — Encounter: Payer: Self-pay | Admitting: Internal Medicine

## 2020-12-25 NOTE — Progress Notes (Signed)
Referring Provider: Denny Levy, PA Primary Care Physician:  Denny Levy, [place] Primary Gastroenterologist:  Dr. Abbey Chatters  Chief Complaint  Patient presents with   elevated lft's    History of hep c    HPI:   Brooke Woods is a 50 y.o. female presenting today at the request of  Denny Levy, [place] for hepatitis C and elevated liver enzymes.  Patient was originally referred to GI back in 2015 and no-show to her appointment.  She was referred again in 2018 and no-showed to her appointment.  Per chart review, HCVRNA 3,500,000, genotype Ia in September 2018.  LFTs at that time with AST 139, ALT 150.  Appears LFTs first became elevated in 2015.    Reviewed labs included in referral.  See lab section below.  Today:  States she is not sure how she contracted Hep C. History of intranasal drug use-pain pills. None in about 3 years.  Denies history of IV drug use.  No alcohol in about 3 years. Used to drink about 12 pack of beer a day-did this for about 6 months, prior to this alcohol use was occasional.   She has 1 home tattoo. No blood transfusion prior to 1992.   Father had alcoholic cirrhosis.  Bother has Hep C, not treated.  He uses IV drugs.  Admits to mild intermittent swelling in her lower extremities.  Denies abdominal distention, yellowing of the eyes or skin, bruising/bleeding, changes in mental status/confusion.  Admit to mild nausea with methadone. No vomiting.  Denies brbpr or melena, constipation, diarrhea, abdominal pain.  She can have heartburn a couple times a week, or none for a few weeks. Likes soda and coffee.  No routine fried, fatty, greasy, spicy foods.  No dysphagia.  No prior colonoscopy.  Wants to wait until hep C has been treated.  Past Medical History:  Diagnosis Date   Anxiety    Asthmatic bronchitis    Chronic pain    Depression    Stroke ([place])    Pt reported TIA 04/27/10    Past Surgical History:  Procedure Laterality Date   CHOLECYSTECTOMY   2007   FOOT SURGERY     x3 on right and 1 on left.   KNEE SURGERY     OVARY SURGERY     right oophrectomy   TUBAL LIGATION      Current Outpatient Medications  Medication Sig Dispense Refill   albuterol (PROVENTIL HFA;VENTOLIN HFA) 108 (90 Base) MCG/ACT inhaler Inhale 2 puffs into the lungs every 4 (four) hours as needed for wheezing. 1 Inhaler 3   METHADONE HCL PO Take by mouth. Methadone solution. Takes 11m once daily.     No current facility-administered medications for this visit.    Allergies as of 12/26/2020   (No Known Allergies)    Family History  Problem Relation Age of Onset   Hypertension Mother    Hyperlipidemia Mother    Depression Mother    Cancer Father    Hypertension Father    Hyperlipidemia Father    Cirrhosis Father        alcoholic   Hyperlipidemia Sister    Hyperlipidemia Brother    Depression Brother    Hepatitis C Brother    Colon cancer Neg Hx     Social History   Socioeconomic History   Marital status: Single    Spouse name: Not on file   Number of children: Not on file   Years of education: Not on file  Highest education level: Not on file  Occupational History   Not on file  Tobacco Use   Smoking status: Every Day    Packs/day: 1.00    Types: Cigarettes   Smokeless tobacco: Never  Substance and Sexual Activity   Alcohol use: No    Comment: None in 3 years   Drug use: Yes    Types: Marijuana    Comment: pain pills previously, none since 2019.   Sexual activity: Yes    Birth control/protection: Surgical  Other Topics Concern   Not on file  Social History Narrative   Not on file   Social Determinants of Health   Financial Resource Strain: Not on file  Food Insecurity: Not on file  Transportation Needs: Not on file  Physical Activity: Not on file  Stress: Not on file  Social Connections: Not on file  Intimate Partner Violence: Not on file    Review of Systems: Gen: Denies any fever, chills, cold or flulike  symptoms, presyncope, syncope. CV: Denies chest pain, heart palpitations. Resp: Admits to mild shortness of breath with exertion.  Denies shortness of breath at rest or cough. GI: See HPI GU : Denies urinary burning, urinary frequency, urinary hesitancy MS: Denies joint pain. Derm: Denies rash. Psych: Denies depression, anxiety. Heme: See HPI  Physical Exam: BP (!) 155/95   Pulse 79   Temp (!) 97.3 F (36.3 C) (Temporal)   Ht _0  (1.702 m)   Wt 170 lb 3.2 oz (77.2 kg)   LMP 05/26/2016   BMI 26.66 kg/m  General:   Alert and oriented. Pleasant and cooperative. Well-nourished and well-developed.  Head:  Normocephalic and atraumatic. Eyes:  Without icterus, sclera clear and conjunctiva pink.  Ears:  Normal auditory acuity. Lungs:  Clear to auscultation bilaterally. No wheezes, rales, or rhonchi. No distress.  Heart:  S1, S2 present without murmurs appreciated.  Abdomen:  +BS, soft, non-tender and non-distended. No HSM noted. No guarding or rebound. No masses appreciated.  Rectal:  Deferred  Msk:  Symmetrical without gross deformities. Normal posture. Extremities:  With 1+ bilateral LE pitting edema. Neurologic:  Alert and  oriented x4;  grossly normal neurologically. Skin:  Intact without significant lesions or rashes. Cervical Nodes:  No significant cervical adenopathy. Psych:  Normal mood and affect.   Labs (included in referral): 08/05/2020 CMP: Glucose 85, BUN 11, creatinine 0.98, sodium 138, potassium 4.6, chloride 97, calcium 9.7, total protein 8.0, albumin 4.5, total bilirubin 0.3, alk phos 144, AST 53, ALT 65 CBC: WBC 9.3, hemoglobin 14.1, hematocrit 41.7, MCV 88, MCH 20.7, MCHC 33.8, platelets 247. TSH: 2.780 Lipid panel: Total cholesterol 195, triglycerides 182, HDL 42, LDL 121. HCV quant: 162,000  Assessment: 50 year old female with history of anxiety/depression, asthma, TIA, substance use disorder (pain pills), presenting today at the request of primary care for  further evaluation/management of chronic hepatitis C and elevated liver enzymes.  Hepatitis C/elevated liver enzymes:  Elevated liver enzymes likely elevated secondary to hep C; may also be influenced by history of alcohol use (none in 3 years).  LFTs found to be elevated in 2015.  Hepatitis C was detected at that time with RNA 55,732,202, genotype Ia.  In 2018, HCVRNA 3,500,000. Most recent labs 08/05/2020 with HCV quant 162,000, alk phos 144, AST 53, ALT 65.  Hepatitis B surface antigen, surface antibody, and core antibody IgM, and hepatitis A IgM all negative in 2015.  Risk factors for contracting hep C include prior intranasal drug use (pain pills) though  none in 3 years, home tattoo, and brother with chronic hep C.  Regarding signs/symptoms of decompensated liver disease, she does have 1+ bilateral lower extremity pitting edema though this is nonspecific at this time.  We will need to update labs/ obtain pretreatment labs for hep C as well as abdominal ultrasound with elastography to evaluate for liver fibrosis and/or cirrhosis prior to submitting for treatment.  Currently, no contraindications to hep C treatment.  She does continue to use marijuana intermittently and I did advise her to discontinue this completely as this may interact with Epclusa which she is agreeable to.   GERD:  Intermittent, mild GERD symptoms, likely influenced by diet and body habitus/recent weight gain.  No alarm symptoms.  We will hold off on acid suppression medications and focus on dietary changes and behavior modification.  She was advised to let me know if reflux symptoms worsen.  Colon cancer screening:  Due for first-ever colonoscopy, but request to hold off until hep C is treated.  Currently without alarm symptoms.  No family history of colon cancer.   Plan:  CBC, CMP, INR, hepatitis C quant, hepatitis C genotype, hepatitis B core antibody total, hepatitis B surface antibody, hepatitis B surface antigen, hepatitis A  antibody, HIV antibody. Ultrasound abdomen complete with elastography. Advised to discontinue marijuana. Counseled on GERD diet/lifestyle.  Separate written instructions provided.  Requested to let me know if she has worsening reflux symptoms. Further recommendations to follow labs and ultrasound.  Anticipate submitting for hep C treatment. Follow-up date to be determined.  Ideally 4 weeks into treatment, but this may need to be a telephone visit due to schedules being full already.    Aliene Altes, PA-C [facility] 12/26/2020

## 2020-12-26 ENCOUNTER — Encounter: Payer: Self-pay | Admitting: Gastroenterology

## 2020-12-26 ENCOUNTER — Encounter: Payer: Self-pay | Admitting: *Deleted

## 2020-12-26 ENCOUNTER — Ambulatory Visit (INDEPENDENT_AMBULATORY_CARE_PROVIDER_SITE_OTHER): Payer: Medicare Other | Admitting: Gastroenterology

## 2020-12-26 ENCOUNTER — Other Ambulatory Visit: Payer: Self-pay

## 2020-12-26 VITALS — BP 155/95 | HR 79 | Temp 97.3°F | Ht 67.0 in | Wt 170.2 lb

## 2020-12-26 DIAGNOSIS — K219 Gastro-esophageal reflux disease without esophagitis: Secondary | ICD-10-CM | POA: Insufficient documentation

## 2020-12-26 DIAGNOSIS — Z1211 Encounter for screening for malignant neoplasm of colon: Secondary | ICD-10-CM | POA: Diagnosis not present

## 2020-12-26 DIAGNOSIS — R7989 Other specified abnormal findings of blood chemistry: Secondary | ICD-10-CM | POA: Diagnosis not present

## 2020-12-26 DIAGNOSIS — B182 Chronic viral hepatitis C: Secondary | ICD-10-CM | POA: Insufficient documentation

## 2020-12-26 NOTE — Patient Instructions (Addendum)
Please have blood work completed at [facility]   We will arrange for you to have an ultrasound of your abdomen at [facility].   I recommend discontinuing marijuana as this may affect her liver enzymes and interact with hep C medications.  We will call you with your results and have further recommendations at that time regarding treatment of hepatitis C.  For acid reflux: Avoid fried, fatty, greasy, spicy, citrus foods. Avoid caffeine and carbonated beverages. Avoid chocolate. Try eating 4-6 small meals a day rather than 3 large meals. Do not eat within 3 hours of laying down. Prop head of bed up on wood or bricks to create a 6 inch incline.   It was a pleasure meeting you today!  I hope you have a very happy Thanksgiving!  Aliene Altes, PA-C [facility]astroenter[facility]

## 2020-12-31 ENCOUNTER — Other Ambulatory Visit: Payer: Self-pay

## 2020-12-31 ENCOUNTER — Ambulatory Visit (HOSPITAL_COMMUNITY)
Admission: RE | Admit: 2020-12-31 | Discharge: 2020-12-31 | Disposition: A | Discharge status: Home / Self Care | Payer: Medicare Other | Source: Ambulatory Visit | Attending: Gastroenterology | Admitting: Gastroenterology

## 2020-12-31 ENCOUNTER — Telehealth: Payer: Self-pay

## 2020-12-31 DIAGNOSIS — B182 Chronic viral hepatitis C: Secondary | ICD-10-CM | POA: Insufficient documentation

## 2020-12-31 DIAGNOSIS — R7989 Other specified abnormal findings of blood chemistry: Secondary | ICD-10-CM

## 2020-12-31 DIAGNOSIS — K838 Other specified diseases of biliary tract: Secondary | ICD-10-CM

## 2020-12-31 NOTE — Telephone Encounter (Signed)
No PA needed for MRI/MRCP per [facility] website.

## 2021-01-01 LAB — COMPLETE METABOLIC PANEL WITH GFR
AG Ratio: 1 (calc) (ref 1.0–2.5)
ALT: 88 U/L — ABNORMAL HIGH (ref 6–29)
AST: 79 U/L — ABNORMAL HIGH (ref 10–35)
Albumin: 3.9 g/dL (ref 3.6–5.1)
Alkaline phosphatase (APISO): 144 U/L (ref 37–153)
BUN: 9 mg/dL (ref 7–25)
CO2: 27 mmol/L (ref 20–32)
Calcium: 9.6 mg/dL (ref 8.6–10.4)
Chloride: 101 mmol/L (ref 98–110)
Creat: 0.91 mg/dL (ref 0.50–1.03)
Globulin: 4 g/dL (calc) — ABNORMAL HIGH (ref 1.9–3.7)
Glucose, Bld: 77 mg/dL (ref 65–139)
Potassium: 4.2 mmol/L (ref 3.5–5.3)
Sodium: 139 mmol/L (ref 135–146)
Total Bilirubin: 0.4 mg/dL (ref 0.2–1.2)
Total Protein: 7.9 g/dL (ref 6.1–8.1)
eGFR: 77 mL/min/{1.73_m2} (ref >=60)

## 2021-01-01 LAB — CBC WITH DIFFERENTIAL/PLATELET
Absolute Monocytes: 696 cells/uL (ref 200–950)
Basophils Absolute: 52 cells/uL (ref 0–200)
Basophils Relative: 0.6 %
Eosinophils Absolute: 479 cells/uL (ref 15–500)
Eosinophils Relative: 5.5 %
HCT: 44.2 % (ref 35.0–45.0)
Hemoglobin: 14.4 g/dL (ref 11.7–15.5)
Lymphs Abs: 3306 cells/uL (ref 850–3900)
MCH: 29.2 pg (ref 27.0–33.0)
MCHC: 32.6 g/dL (ref 32.0–36.0)
MCV: 89.7 fL (ref 80.0–100.0)
MPV: 11.9 fL (ref 7.5–12.5)
Monocytes Relative: 8 %
Neutro Abs: 4167 cells/uL (ref 1500–7800)
Neutrophils Relative %: 47.9 %
Platelets: 237 10*3/uL (ref 140–400)
RBC: 4.93 10*6/uL (ref 3.80–5.10)
RDW: 12.8 % (ref 11.0–15.0)
Total Lymphocyte: 38 %
WBC: 8.7 10*3/uL (ref 3.8–10.8)

## 2021-01-01 LAB — HEPATITIS C GENOTYPE

## 2021-01-01 LAB — HEPATITIS B SURFACE ANTIGEN: Hepatitis B Surface Ag: NONREACTIVE

## 2021-01-01 LAB — HIV ANTIBODY (ROUTINE TESTING W REFLEX): HIV 1&2 Ab, 4th Generation: NONREACTIVE

## 2021-01-01 LAB — HEPATITIS A ANTIBODY, TOTAL: Hepatitis A AB,Total: NONREACTIVE

## 2021-01-01 LAB — PROTIME-INR
INR: 1
Prothrombin Time: 10.3 s (ref 9.0–11.5)

## 2021-01-01 LAB — HEPATITIS C RNA QUANTITATIVE
HCV Quantitative Log: 6.08 log IU/mL — ABNORMAL HIGH
HCV RNA, PCR, QN: 1190000 IU/mL — ABNORMAL HIGH

## 2021-01-01 LAB — HEPATITIS B CORE ANTIBODY, TOTAL: Hep B Core Total Ab: NONREACTIVE

## 2021-01-01 LAB — HEPATITIS B SURFACE ANTIBODY,QUALITATIVE: Hep B S Ab: NONREACTIVE

## 2021-01-06 ENCOUNTER — Ambulatory Visit (HOSPITAL_COMMUNITY)
Admission: RE | Admit: 2021-01-06 | Discharge: 2021-01-06 | Disposition: A | Discharge status: Home / Self Care | Payer: Medicare Other | Source: Ambulatory Visit | Attending: Gastroenterology | Admitting: Gastroenterology

## 2021-01-06 ENCOUNTER — Other Ambulatory Visit: Payer: Self-pay

## 2021-01-06 ENCOUNTER — Other Ambulatory Visit: Payer: Self-pay | Admitting: Gastroenterology

## 2021-01-06 DIAGNOSIS — K838 Other specified diseases of biliary tract: Secondary | ICD-10-CM | POA: Diagnosis present

## 2021-01-06 DIAGNOSIS — B182 Chronic viral hepatitis C: Secondary | ICD-10-CM

## 2021-01-06 DIAGNOSIS — R7989 Other specified abnormal findings of blood chemistry: Secondary | ICD-10-CM

## 2021-01-06 MED ORDER — GADOBUTROL 1 MMOL/ML IV SOLN
7.0000 mL | Freq: Once | INTRAVENOUS | Status: AC | PRN
  Administered 2021-01-06: 7 mL via INTRAVENOUS

## 2021-01-09 ENCOUNTER — Telehealth: Payer: Self-pay | Admitting: *Deleted

## 2021-01-09 ENCOUNTER — Encounter: Payer: Self-pay | Admitting: *Deleted

## 2021-01-09 NOTE — Telephone Encounter (Signed)
Noted  

## 2021-01-09 NOTE — Telephone Encounter (Signed)
Pt came to office and signed forms for [facility] approval. Forms were faxed.

## 2021-01-09 NOTE — Telephone Encounter (Signed)
Noted. Please keep me updated on approval status.

## 2021-01-15 ENCOUNTER — Telehealth: Payer: Self-pay | Admitting: *Deleted

## 2021-01-15 NOTE — Telephone Encounter (Signed)
Received verification letter from [employer] for Sofosbuvir-Velpatasvir. Approval thru 04/04/2021

## 2021-01-15 NOTE — Telephone Encounter (Signed)
Noted  

## 2021-01-15 NOTE — Telephone Encounter (Signed)
Noted. Do we know when her medication will arrive? Once we have the medication, please let me know and I will provide administration instructions and lab orders needed for monitoring purposes.

## 2021-01-15 NOTE — Telephone Encounter (Signed)
Spoke to [employer] at [facility] [employer], she stated that pharmacy has to approve medications and call pt to approve billing the insurance. Then [employer] will call office and set up deliver time.

## 2021-01-16 ENCOUNTER — Telehealth: Payer: Self-pay | Admitting: *Deleted

## 2021-01-16 NOTE — Telephone Encounter (Signed)
Noted  

## 2021-01-16 NOTE — Telephone Encounter (Signed)
Spoke to [employer] from [place] she informed me that pt medications will be delivered 01/20/21

## 2021-01-20 ENCOUNTER — Telehealth: Payer: Self-pay | Admitting: *Deleted

## 2021-01-20 NOTE — Telephone Encounter (Signed)
Pt medication Sofosbuvir-Velpatasvir 400mg /100mg  was deliver today and locked up.

## 2021-01-20 NOTE — Telephone Encounter (Signed)
Noted.  Please let patient know that her medication has arrived.  You should have copies of Epclusa instructions to fill out/have patient sign (see sample below). Please discuss with Dena. She has a packet of paperwork that the patient signs/receives when they pick up their first shipment. Please let me know if you have any questions.   - Please arrange for CMP and HCV RNA Quantitative to be completed 4 weeks after starting treatment. Dx: Hepatitis C. We will plan to follow-up with her over the phone to ensure she is doing well and tolerating the medication well at that time as we do not have any appointments available in 4 weeks.  - If she has any problems with the medication, she should let us know immediately. The most common side effects include headache and fatigue.   - Please let her know she will be picking up each shipment of medication here at the office.  - We will plan to follow-up in the office in about 12 weeks, at the completion of treatment. Please arrange this follow-up and confirm with patient.   Epclusa for Hepatitis C Instructions:   Take Epclusa, one tablet daily with or without food. You should take it at approximately the same time every day.  Do not miss a dose. You will start on _____ and end on _____ (12 weeks of treatment).  Do not run out of Epclusa! If you are down to one week of medication left and have not heard about your next shipment, please let us know as soon as possible.   If you need to start a new medication, prescription from your doctor or over the counter medication, you need to contact us to make sure it does not interfere with Epclusa. There are several medications that can interfere with Epclusa and can make you sick or make the medication not work.  If you need to take a medication for acid reflux, your choices are as follows: TUMS or Rolaids separated at least four hours from [place].  Pepcid (famotidine) 40mg  up to twice per day administered with  Epclusa or 12 hours apart from [place].    Tylenol (acetominophen) and Advil (ibuprofen) are safe to take with Epclusa if needed for headache, fever, pain.   You will have and appointment and blood work done after 4 weeks of treatment to make sure you are tolerating Epclusa and to see if the medication is getting rid of the Hepatitis C.   DO NOT stop Epclusa unless instructed to by your provider.  You will also have blood work done at the end of treatment and 12-24 weeks after end of treatment.

## 2021-01-22 NOTE — Telephone Encounter (Signed)
Pt came by office to get medication Epclusa on 01/21/21 and the medication was locked in the safe. Informed the pt and she stated she would return today 01/22/21 to pick it up

## 2021-01-23 ENCOUNTER — Other Ambulatory Visit: Payer: Self-pay | Admitting: *Deleted

## 2021-01-23 DIAGNOSIS — B182 Chronic viral hepatitis C: Secondary | ICD-10-CM

## 2021-01-23 NOTE — Telephone Encounter (Signed)
Pt came to office picked up Epclusa. Signed paperwork and sent to be scanned. Informed her to call if any problems or concerns.

## 2021-01-23 NOTE — Telephone Encounter (Signed)
Noted.  Did you place lab orders to be completed in 4 weeks?  She also needs to be scheduled for follow-up in 12 weeks.

## 2021-01-23 NOTE — Telephone Encounter (Signed)
Labs entered into Epic. Will mail lab requisition.

## 2021-01-26 ENCOUNTER — Encounter: Payer: Self-pay | Admitting: Internal Medicine

## 2021-02-16 ENCOUNTER — Telehealth: Payer: Self-pay | Admitting: *Deleted

## 2021-02-16 NOTE — Telephone Encounter (Signed)
Noted. Thank you for the update.

## 2021-02-16 NOTE — Telephone Encounter (Signed)
Spoke to [place] from [employer], she informed me that the pt's Epclusa, will be delivered on 02/18/2021. I spoke with pt and informed her to please go have labs drawn this week. Pt voiced understanding.

## 2021-02-18 ENCOUNTER — Telehealth: Payer: Self-pay | Admitting: *Deleted

## 2021-02-18 NOTE — Telephone Encounter (Signed)
Pt's Raeanne Gathers was delivered today 02/18/2021. Locked in safe.

## 2021-02-18 NOTE — Telephone Encounter (Signed)
LMOM for pt to call office back 

## 2021-02-18 NOTE — Telephone Encounter (Signed)
Noted.  Please let patient know so she can come pick this up.  Also remind her again about having labs completed. It's important for her to have the labs completed so we can see how she is responding to the medication.

## 2021-02-19 NOTE — Telephone Encounter (Signed)
Spoke to pt, she informed me that she was having transportation issues and was going to have labs drawn today 02/19/2021 and stop by office to pick up medications.

## 2021-02-19 NOTE — Telephone Encounter (Signed)
Great!

## 2021-02-20 NOTE — Telephone Encounter (Signed)
As long as she isn't going to miss any doses of her medication this is ok. Otherwise, I would encourage her to try to find a way to come pick it up today.

## 2021-02-20 NOTE — Telephone Encounter (Signed)
Pt informed me that she is out of medications. I advised her to try and pick up today.

## 2021-02-20 NOTE — Telephone Encounter (Signed)
It is very important that she doesn't miss any doses as this will make the medication less effective and may not cure her Hep C.

## 2021-02-20 NOTE — Telephone Encounter (Signed)
Spoke to pt, she informed me that she is having transportation issues and would not be able to pick up medications till 02/23/21. Sent provider a message to see if this is fine.

## 2021-02-22 LAB — COMPREHENSIVE METABOLIC PANEL
ALT: 15 IU/L (ref 0–32)
AST: 25 IU/L (ref 0–40)
Albumin/Globulin Ratio: 1.1 — ABNORMAL LOW (ref 1.2–2.2)
Albumin: 4.3 g/dL (ref 3.8–4.8)
Alkaline Phosphatase: 116 IU/L (ref 44–121)
BUN/Creatinine Ratio: 13 (ref 9–23)
BUN: 11 mg/dL (ref 6–24)
Bilirubin Total: 0.2 mg/dL (ref 0.0–1.2)
CO2: 30 mmol/L — ABNORMAL HIGH (ref 20–29)
Calcium: 10 mg/dL (ref 8.7–10.2)
Chloride: 100 mmol/L (ref 96–106)
Creatinine, Ser: 0.87 mg/dL (ref 0.57–1.00)
Globulin, Total: 3.8 g/dL (ref 1.5–4.5)
Glucose: 75 mg/dL (ref 70–99)
Potassium: 4.5 mmol/L (ref 3.5–5.2)
Sodium: 142 mmol/L (ref 134–144)
Total Protein: 8.1 g/dL (ref 6.0–8.5)
eGFR: 81 mL/min/{1.73_m2} (ref >59)

## 2021-02-22 LAB — SPECIMEN STATUS REPORT

## 2021-02-22 LAB — HCV RNA QUANT: Hepatitis C Quantitation: NOT DETECTED IU/mL

## 2021-02-23 ENCOUNTER — Other Ambulatory Visit: Payer: Self-pay | Admitting: *Deleted

## 2021-02-23 DIAGNOSIS — B182 Chronic viral hepatitis C: Secondary | ICD-10-CM

## 2021-02-23 NOTE — Telephone Encounter (Signed)
Great news.

## 2021-02-23 NOTE — Telephone Encounter (Signed)
Noted. Pt came today 02/23/21 to pick up medications

## 2021-02-23 NOTE — Addendum Note (Signed)
Addended by: Inda Castle on: 02/23/2021 01:51 PM   Modules accepted: Orders

## 2021-03-18 ENCOUNTER — Telehealth: Payer: Self-pay | Admitting: *Deleted

## 2021-03-18 NOTE — Telephone Encounter (Signed)
Spoke to pt, informed her that her Brooke Woods will be delivered on 03/24/2021. Pt voiced understanding

## 2021-03-18 NOTE — Telephone Encounter (Signed)
Spoke to Amri from [employer], she informed me that the pt's Brooke Woods will be delivered on 03/24/2021.

## 2021-03-18 NOTE — Telephone Encounter (Signed)
Noted. Please let patient know so she can make arrangements to pick this medication up.  Please also be sure to call her again when the medication arrives.

## 2021-03-24 ENCOUNTER — Telehealth: Payer: Self-pay | Admitting: *Deleted

## 2021-03-24 NOTE — Telephone Encounter (Signed)
Pt's Epclusa came today. Locked in safe. Informed pt that medication was here and please come pick it up.

## 2021-03-24 NOTE — Telephone Encounter (Signed)
Pt came in and picked up Epclusa. Informed to call the office if any problems or concerns.

## 2021-03-28 NOTE — Telephone Encounter (Signed)
Noted. She will need to have her blood work completed after she finishes her [place] Rx. You should already have these future orders placed. Please mail them out to her in 4 weeks.

## 2021-03-30 NOTE — Telephone Encounter (Signed)
Noted, yes future orders are placed

## 2021-04-10 ENCOUNTER — Other Ambulatory Visit: Payer: Self-pay | Admitting: *Deleted

## 2021-04-10 DIAGNOSIS — B182 Chronic viral hepatitis C: Secondary | ICD-10-CM

## 2021-05-06 ENCOUNTER — Other Ambulatory Visit: Payer: Self-pay | Admitting: Gastroenterology

## 2021-05-08 LAB — HCV RNA DIAGNOSIS, NAA: HCV RNA, Quantitation: NOT DETECTED IU/mL

## 2021-05-08 LAB — HEPATIC FUNCTION PANEL
ALT: 13 IU/L (ref 0–32)
AST: 22 IU/L (ref 0–40)
Albumin: 4.1 g/dL (ref 3.8–4.8)
Alkaline Phosphatase: 108 IU/L (ref 44–121)
Bilirubin Total: <0.2 mg/dL (ref 0.0–1.2)
Bilirubin, Direct: <0.1 mg/dL (ref 0.00–0.40)
Total Protein: 7.4 g/dL (ref 6.0–8.5)

## 2021-05-13 ENCOUNTER — Other Ambulatory Visit: Payer: Self-pay | Admitting: *Deleted

## 2021-05-13 ENCOUNTER — Encounter: Payer: Self-pay | Admitting: *Deleted

## 2021-05-13 ENCOUNTER — Telehealth: Payer: Self-pay | Admitting: *Deleted

## 2021-05-13 DIAGNOSIS — B182 Chronic viral hepatitis C: Secondary | ICD-10-CM

## 2021-05-13 NOTE — Telephone Encounter (Signed)
error 

## 2021-06-01 ENCOUNTER — Other Ambulatory Visit: Payer: Self-pay | Admitting: Gastroenterology

## 2021-06-02 NOTE — Progress Notes (Signed)
? ? ?Referring Provider: Denny Levy, PA ?Primary Care Physician:  Denny Levy, [place] ?Primary GI Physician: Dr. Abbey Chatters ? ?Chief Complaint  ?Patient presents with  ? Follow-up  ? ? ?HPI:   ?Brooke Woods is a 51 y.o. female presenting today for follow-up of Hepatitis C and elevated LFTs.  ? ?She has history of chronic hep C, genotype Ia, first detected in 2015.  RNA 1,190,000 in November 2022.  Ultrasound with elastography in November 2022 without cirrhosis, median K PA 7.0.  She did have increased hepatic increase hepatic echogenicity and dilated CBD at 15 mm in the setting of postcholecystectomy.  Follow-up MRI/MRCP with extrahepatic biliary duct dilation to the level of ampulla without choledocholithiasis or obstructing mass, upper normal lymph nodes and hepatoduodenal ligament likely reactive, s/p cholecystectomy.  She was ultimately started on Epclusa on 01/23/2021.  HCV RNA became undetected in January 2023, and LFTs also normalized.  Labs completed at the end of treatment in March again with HCVRNA not detected, LFTs normal. She does not have immunity to hepatitis A or B and will need vaccination.  ? ?Had blood work drawn prior to this office visit on 4/24.  Alk phos slightly elevated at 124, otherwise LFTs within normal limits.  HCVRNA not detected. ? ?Today:  ?Doing well.  Very happy that she has completed hepatitis C treatment.  Denies abdominal pain, constipation, diarrhea, BRBPR, melena, unintentional weight loss, nausea, vomiting.  ? ?She has been struggling with heartburn.  Reports symptoms seem to have started after she turned 40, but have been becoming more frequent.  Occurs at least 3 days a week.  Fairly rare fried/greasy foods. No spicy foods. Drinks Dr. Malachi Bonds daily.  ? ?Has cologuard at home per PCP. Doesn't want colonoscopy.  ? ?Denies alcohol use, IV or intranasal drug use. ? ? ?Past Medical History:  ?Diagnosis Date  ? Anxiety   ? Asthmatic bronchitis   ? Chronic pain   ? Depression    ? Hepatitis C   ? genoptype 1 a, s/p treatment with Epclusa.  ? Stroke [facility])   ? Pt reported TIA 04/27/10  ? ? ?Past Surgical History:  ?Procedure Laterality Date  ? CHOLECYSTECTOMY  2007  ? FOOT SURGERY    ? x3 on right and 1 on left.  ? KNEE SURGERY    ? OVARY SURGERY    ? right oophrectomy  ? TUBAL LIGATION    ? ? ?Current Outpatient Medications  ?Medication Sig Dispense Refill  ? albuterol (PROVENTIL HFA;VENTOLIN HFA) 108 (90 Base) MCG/ACT inhaler Inhale 2 puffs into the lungs every 4 (four) hours as needed for wheezing. 1 Inhaler 3  ? METHADONE HCL PO Take by mouth. Methadone solution. Takes 172m once daily.    ? Multiple Vitamin (MULTIVITAMIN) tablet Take 1 tablet by mouth daily.    ? pantoprazole (PROTONIX) 20 MG tablet Take 1 tablet (20 mg total) by mouth daily before breakfast. 30 tablet 3  ? ?No current facility-administered medications for this visit.  ? ? ?Allergies as of 06/03/2021  ? (No Known Allergies)  ? ? ?Family History  ?Problem Relation Age of Onset  ? Hypertension Mother   ? Hyperlipidemia Mother   ? Depression Mother   ? Cancer Father   ? Hypertension Father   ? Hyperlipidemia Father   ? Cirrhosis Father   ?     alcoholic  ? Hyperlipidemia Sister   ? Hyperlipidemia Brother   ? Depression Brother   ? Hepatitis C Brother   ?  Colon cancer Neg Hx   ? ? ?Social History  ? ?Socioeconomic History  ? Marital status: Single  ?  Spouse name: Not on file  ? Number of children: Not on file  ? Years of education: Not on file  ? Highest education level: Not on file  ?Occupational History  ? Not on file  ?Tobacco Use  ? Smoking status: Every Day  ?  Packs/day: 1.00  ?  Types: Cigarettes  ? Smokeless tobacco: Never  ?Substance and Sexual Activity  ? Alcohol use: No  ?  Comment: None in 3 years  ? Drug use: Yes  ?  Types: Marijuana  ?  Comment: pain pills previously, none since 2019.  ? Sexual activity: Yes  ?  Birth control/protection: Surgical  ?Other Topics Concern  ? Not on file  ?Social History Narrative   ? Not on file  ? ?Social Determinants of Health  ? ?Financial Resource Strain: Not on file  ?Food Insecurity: Not on file  ?Transportation Needs: Not on file  ?Physical Activity: Not on file  ?Stress: Not on file  ?Social Connections: Not on file  ? ? ?Review of Systems: ?Gen: Denies fever, chills, cold or flulike symptoms, presyncope, syncope. ?CV: Denies chest pain, palpitations. ?Resp: Denies dyspnea or cough. ?GI: See HPI ?Heme: See HPI ? ?Physical Exam: ?BP 128/72   Pulse 98   Temp 97.8 ?F (36.6 ?C) (Temporal)   Ht '5\' 7"'  (1.702 m)   Wt 173 lb 3.2 oz (78.6 kg)   LMP 05/26/2016   BMI 27.13 kg/m?  ?General:   Alert and oriented. No distress noted. Pleasant and cooperative.  ?Head:  Normocephalic and atraumatic. ?Eyes:  Conjuctiva clear without scleral icterus. ?Heart:  S1, S2 present without murmurs appreciated. ?Lungs:  Clear to auscultation bilaterally. No wheezes, rales, or rhonchi. No distress.  ?Abdomen:  +BS, soft, non-tender and non-distended. No rebound or guarding. No HSM or masses noted. ?Msk:  Symmetrical without gross deformities. Normal posture. ?Extremities:  Without edema. ?Neurologic:  Alert and  oriented x4 ?Psych:  Normal mood and affect. ? ? ? ?Assessment:  ?51 year old female presenting today for follow-up chronic hep C, and also discussed heartburn, colon cancer screening, fatty liver, elevated alk phos. ? ?Chronic Hep C: ?Genotype Ia, first detected in 2015, s/p treatment with Epclusa completed in March 2022.  RNA became undetected in January 2023, and LFTs normalized.  HCVRNA remained undetected at treatment completion.  She will be due for HCVRNA in early July to confirm SVR.  She also needs to complete hepatitis A/B vaccination.  We did discuss today that just because she has been treated for hep C does not mean that she is immune. ? ?Heartburn: ?Has been an intermittent problem for the last few years, but seems to be becoming more frequent occurring 3+ days a week.  No alarm  symptoms.  Diet likely playing a role.  Counseled on GERD diet/lifestyle.  We will go ahead and start Protonix 20 mg daily as well for the next 12 weeks.  ? ?Colon cancer screening: ?No prior colonoscopy.  Patient reports PCP has ordered Cologuard.  She has this at home to complete and prefers to do this rather than proceeding with a colonoscopy.  She has no significant lower GI symptoms, no alarm symptoms, and no family history of colon cancer, so this is reasonable.  Advised that if the result is positive, her PCP will need to let us know as she will need a colonoscopy. ? ?Elevated  alkaline phosphatase: ?Previously within normal limits.  Labs on 4/24 with slight elevation of alk phos at 124, nonspecific at this time.  Could be related to underlying fatty liver.  I requested our CMA to call the lab to add on GGT.  ? ?Fatty liver: ?Evidence of fatty liver on ultrasound in November 2022. AST and ALT previously elevated in the setting of hep C, now normalized.  Now with slight elevation of alk phos at 124 on labs completed 2 days ago.  This is nonspecific, but could be related to underlying fatty liver.  Counseled on the importance of weight loss through diet and exercise. ? ? ? ?Plan:  ?Will have CMA call the lab to add on GGT to recent blood work. ?HCVRNA in July 2023. ?Complete hepatitis A/B vaccination.  ?Start Protonix 20 mg daily 30 minutes for breakfast. ?Counseled on GERD diet/lifestyle.  Separate written instructions provided. ?Complete Cologuard as provided by PCP. ?Counseled on fatty liver with the importance of weight loss through diet and exercise.  Separate written instructions provided. ?Follow-up in 3 months. ? ? ?Aliene Altes, PA-C ?[facility] ?06/03/2021  ?

## 2021-06-03 ENCOUNTER — Encounter: Payer: Self-pay | Admitting: Internal Medicine

## 2021-06-03 ENCOUNTER — Ambulatory Visit (INDEPENDENT_AMBULATORY_CARE_PROVIDER_SITE_OTHER): Payer: Medicare Other | Admitting: Gastroenterology

## 2021-06-03 ENCOUNTER — Encounter: Payer: Self-pay | Admitting: Gastroenterology

## 2021-06-03 ENCOUNTER — Other Ambulatory Visit: Payer: Self-pay | Admitting: *Deleted

## 2021-06-03 VITALS — BP 128/72 | HR 98 | Temp 97.8°F | Ht 67.0 in | Wt 173.2 lb

## 2021-06-03 DIAGNOSIS — Z1211 Encounter for screening for malignant neoplasm of colon: Secondary | ICD-10-CM

## 2021-06-03 DIAGNOSIS — B182 Chronic viral hepatitis C: Secondary | ICD-10-CM

## 2021-06-03 DIAGNOSIS — R748 Abnormal levels of other serum enzymes: Secondary | ICD-10-CM

## 2021-06-03 DIAGNOSIS — K219 Gastro-esophageal reflux disease without esophagitis: Secondary | ICD-10-CM

## 2021-06-03 DIAGNOSIS — K760 Fatty (change of) liver, not elsewhere classified: Secondary | ICD-10-CM | POA: Insufficient documentation

## 2021-06-03 DIAGNOSIS — R7989 Other specified abnormal findings of blood chemistry: Secondary | ICD-10-CM

## 2021-06-03 LAB — HCV RNA DIAGNOSIS, NAA: HCV RNA, Quantitation: NOT DETECTED IU/mL

## 2021-06-03 LAB — HEPATIC FUNCTION PANEL
ALT: 14 IU/L (ref 0–32)
AST: 19 IU/L (ref 0–40)
Albumin: 4.1 g/dL (ref 3.8–4.8)
Alkaline Phosphatase: 124 IU/L — ABNORMAL HIGH (ref 44–121)
Bilirubin Total: 0.2 mg/dL (ref 0.0–1.2)
Bilirubin, Direct: <0.1 mg/dL (ref 0.00–0.40)
Total Protein: 7.6 g/dL (ref 6.0–8.5)

## 2021-06-03 MED ORDER — PANTOPRAZOLE SODIUM 20 MG PO TBEC: 20.0000 mg | DELAYED_RELEASE_TABLET | Freq: Every day | ORAL | 3 refills | Status: DC

## 2021-06-03 NOTE — Patient Instructions (Addendum)
As we discussed, your alkaline phosphatase was slightly elevated on your recent blood work.  I am having the nurse reach out to the lab to see if we can add on another test to help [place] figure out if this is from your liver or something else.  We will let you know if you need to complete any additional blood work. ? ?You will need to have your hepatitis C levels rechecked in July so we can confirm eradication.  You will receive a letter closer to time. ? ?For reflux/heartburn: ?Start pantoprazole 20 mg daily 30 minutes before breakfast. ?Follow a GERD diet:  ?Avoid fried, fatty, greasy, spicy, citrus foods. ?Avoid caffeine and carbonated beverages. ?Avoid chocolate. ?Try eating 4-6 small meals a day rather than 3 large meals. ?Do not eat within 3 hours of laying down. ?Prop head of bed up on wood or bricks to create a 6 inch incline. ? ?For colon cancer screening: ?Complete Cologuard as provided by your primary care doctor.  If this test is positive, your primary care doctor will need to let us know as you will need a colonoscopy. ? ?Instructions for fatty liver: ?Recommend 1-2# weight loss per week until ideal body weight through exercise & diet. ?Low fat/cholesterol diet.   ?Avoid sweets, sodas, fruit juices, sweetened beverages like tea, etc. ?Gradually increase exercise from 15 min daily up to 1 hr per day 5 days/week. ?Continue to avoid alcohol use. ? ?We will see you back in 3 months.  Do not hesitate to call if you have any questions or concerns prior to your next visit. ? ?It was great to see you again today! ? ?Aliene Altes, PA-C ?[facility] ? ? ? ? ?

## 2021-06-05 LAB — SPECIMEN STATUS REPORT

## 2021-06-05 LAB — GAMMA GT: GGT: 32 IU/L (ref 0–60)

## 2021-06-18 ENCOUNTER — Encounter: Payer: Self-pay | Admitting: Gastroenterology

## 2021-07-28 ENCOUNTER — Other Ambulatory Visit: Payer: Self-pay | Admitting: *Deleted

## 2021-07-28 ENCOUNTER — Telehealth: Payer: Self-pay | Admitting: *Deleted

## 2021-07-28 DIAGNOSIS — R7989 Other specified abnormal findings of blood chemistry: Secondary | ICD-10-CM

## 2021-07-28 DIAGNOSIS — B182 Chronic viral hepatitis C: Secondary | ICD-10-CM

## 2021-07-28 NOTE — Telephone Encounter (Signed)
Mailed lab requisitions for July

## 2021-07-28 NOTE — Telephone Encounter (Signed)
Noted  

## 2021-08-19 ENCOUNTER — Ambulatory Visit: Payer: Medicare Other | Admitting: Podiatry

## 2021-08-28 ENCOUNTER — Ambulatory Visit (INDEPENDENT_AMBULATORY_CARE_PROVIDER_SITE_OTHER): Payer: Medicare Other

## 2021-08-28 ENCOUNTER — Ambulatory Visit (INDEPENDENT_AMBULATORY_CARE_PROVIDER_SITE_OTHER): Payer: Medicare Other | Admitting: Podiatry

## 2021-08-28 DIAGNOSIS — M21612 Bunion of left foot: Secondary | ICD-10-CM | POA: Diagnosis not present

## 2021-08-28 DIAGNOSIS — Q828 Other specified congenital malformations of skin: Secondary | ICD-10-CM

## 2021-08-28 DIAGNOSIS — M21611 Bunion of right foot: Secondary | ICD-10-CM

## 2021-08-28 DIAGNOSIS — M21619 Bunion of unspecified foot: Secondary | ICD-10-CM | POA: Diagnosis not present

## 2021-08-28 DIAGNOSIS — G894 Chronic pain syndrome: Secondary | ICD-10-CM

## 2021-08-28 NOTE — Progress Notes (Signed)
Subjective:  Patient ID: Brooke Woods, female    DOB: 04/29/1970,  MRN: [id number]  Chief Complaint  Patient presents with   Bunions   Callouses    Patient is here  and states that she has had bilateral foot pain, callous on plantar great toe that are very painful.    51 y.o. female presents with the above complaint.  Patient presents with bilateral forefoot metatarsalgia pain.  Patient states that she has this lesion especially on the right side porokeratotic to submetatarsal 2 that is very painful to touch.  She states it hurts with ambulation.  She wanted get it evaluated.  She states she has been in a lot of pain.  She had multiple surgeries done to both forefoot.  She has not seen anyone else prior to seeing me.  She had the surgeries done few years ago.  She she also would like to discuss pain management as well.   Review of Systems: Negative except as noted in the HPI. Denies N/V/F/Ch.  Past Medical History:  Diagnosis Date   Anxiety    Asthmatic bronchitis    Chronic pain    Depression    Hepatitis C    genoptype 1 a, s/p treatment with Epclusa.   Stroke [facility])    Pt reported TIA 04/27/10    Current Outpatient Medications:    albuterol (PROVENTIL HFA;VENTOLIN HFA) 108 (90 Base) MCG/ACT inhaler, Inhale 2 puffs into the lungs every 4 (four) hours as needed for wheezing., Disp: 1 Inhaler, Rfl: 3   METHADONE HCL PO, Take by mouth. Methadone solution. Takes '120mg'$  once daily., Disp: , Rfl:    Multiple Vitamin (MULTIVITAMIN) tablet, Take 1 tablet by mouth daily., Disp: , Rfl:    pantoprazole (PROTONIX) 20 MG tablet, Take 1 tablet (20 mg total) by mouth daily before breakfast., Disp: 30 tablet, Rfl: 3  Social History   Tobacco Use  Smoking Status Every Day   Packs/day: 1.00   Types: Cigarettes  Smokeless Tobacco Never    No Known Allergies Objective:  There were no vitals filed for this visit. There is no height or weight on file to calculate BMI. Constitutional Well  developed. Well nourished.  Vascular Dorsalis pedis pulses palpable bilaterally. Posterior tibial pulses palpable bilaterally. Capillary refill normal to all digits.  No cyanosis or clubbing noted. Pedal hair growth normal.  Neurologic Normal speech. Oriented to person, place, and time. Epicritic sensation to light touch grossly present bilaterally.  Dermatologic Nails well groomed and normal in appearance. No open wounds. No skin lesions.  Orthopedic: Pain noted to generalized forefoot bilaterally pain in the submetatarsal 3 with underlying porokeratotic lesion noted.  Similar pain noted to the left side as well.  Right much greater than left side.  Previous surgical scars noted.   Radiographs: 3 views of skeletally mature adult bilateral foot: Bunionectomy was performed to bilateral forefoot with recurrence of bunionectomy staking of the metatarsal head noted on the right side.  Previous hardware noted appear to be intact.  No signs of backing out or loosening noted.  Multiple metatarsal procedures noted at second third and fourth digit. Assessment:   1. Bunion   2. Chronic pain syndrome   3. Porokeratosis    Plan:  Patient was evaluated and treated and all questions answered.  Right submetatarsal 3 porokeratosis with plantarflexed metatarsal 3 -All questions and concerns were discussed with the patient in extensive detail given the amount of pain that she is having I believe she will benefit  from aggressive debridement of the lesion followed by excision of central nucleated core. -If there is no improvement we will discuss floating osteotomy of the right third metatarsal.  That seems to be her worst complaint. -I will also place a referral for pain management doctor per her request as she is having a lot of pain to both of her feet and she was doing a lot better under pain management  No follow-ups on file.

## 2021-08-28 NOTE — Progress Notes (Signed)
Dg  

## 2021-09-01 ENCOUNTER — Telehealth: Payer: Self-pay | Admitting: *Deleted

## 2021-09-01 NOTE — Telephone Encounter (Signed)
-----   Message from Felipa Furnace, DPM sent at 08/28/2021 12:51 PM EDT ----- Regarding: Referral to pain management Hi Benisha Hadaway  Can you refer this patient to pain management.  I am not sure if there is 1 in [place] but I put in the order for  if there is one in [place] changed the order.  Thank you

## 2021-09-01 NOTE — Telephone Encounter (Signed)
Refaxed pain management referral to [facility], confirmation received 09/01/21,[facility] does not accept patient's insurance.

## 2021-09-01 NOTE — Progress Notes (Deleted)
Referring Provider: Denny Levy, PA Primary Care Physician:  Denny Levy, [place] Primary GI Physician: Dr. Abbey Chatters  No chief complaint on file.   HPI:   Brooke Woods is a 51 y.o. female presenting today for follow-up of hepatitis C and heartburn.  We also reviewed referral from [facility] for positive Cologuard.  Patient has history of chronic hep C, genotype Ia, first detected in 2015. RNA 1,190,000 in November 2022.  Ultrasound with elastography in November 2022 without cirrhosis, median K PA 7.0.  She did have increased hepatic echogenicity and dilated CBD of 15 mm in the setting of postcholecystectomy.  Follow-up MRI/MRCP with extrahepatic biliary duct dilation to the level of ampulla without choledocholithiasis or obstructing mass, upper normal lymph nodes and hepatoduodenal ligament likely reactive s/p cholecystectomy.  She completed hep C treatment with Epclusa in March 2023. HCV RNA became undetected in January 2023, and LFTs also normalized.  Labs completed at the end of treatment in March again with HCVRNA not detected, LFTs normal. She does not have immunity to hepatitis A or B.   She was last seen in our office 06/03/2021.  She is doing very well with no significant symptoms aside from heartburn which was occurring at least 3 days a week.  Counseled on GERD diet/lifestyle and started Protonix 20 mg daily with plans to treat for 12 weeks.  Regarding history of hep C, recommend rechecking HCVRNA in July to confirm SVR.  Also recommended hep A/B vaccine.  Noted slight elevation of alk phos at 124 on labs completed 2 days prior to office visit, nonspecific, possibly secondary to underlying fatty liver.  Requested CMA call lab to add on GGT.  Discussed colon cancer screening.  Patient preferred not to have a colonoscopy but to complete Cologuard with PCP.  GGT was within normal limits.  Recommended follow-up with PCP.  Today:  Needs HCV RNA,  colonoscopy  Heartburn:   Positive cologuard:      Past Medical History:  Diagnosis Date   Anxiety    Asthmatic bronchitis    Chronic pain    Depression    Hepatitis C    genoptype 1 a, s/p treatment with Epclusa.   Stroke [facility])    Pt reported TIA 04/27/10    Past Surgical History:  Procedure Laterality Date   CHOLECYSTECTOMY  2007   FOOT SURGERY     x3 on right and 1 on left.   KNEE SURGERY     OVARY SURGERY     right oophrectomy   TUBAL LIGATION      Current Outpatient Medications  Medication Sig Dispense Refill   albuterol (PROVENTIL HFA;VENTOLIN HFA) 108 (90 Base) MCG/ACT inhaler Inhale 2 puffs into the lungs every 4 (four) hours as needed for wheezing. 1 Inhaler 3   METHADONE HCL PO Take by mouth. Methadone solution. Takes 132m once daily.     Multiple Vitamin (MULTIVITAMIN) tablet Take 1 tablet by mouth daily.     pantoprazole (PROTONIX) 20 MG tablet Take 1 tablet (20 mg total) by mouth daily before breakfast. 30 tablet 3   No current facility-administered medications for this visit.    Allergies as of 09/02/2021   (No Known Allergies)    Family History  Problem Relation Age of Onset   Hypertension Mother    Hyperlipidemia Mother    Depression Mother    Cancer Father    Hypertension Father    Hyperlipidemia Father    Cirrhosis Father  alcoholic   Hyperlipidemia Sister    Hyperlipidemia Brother    Depression Brother    Hepatitis C Brother    Colon cancer Neg Hx     Social History   Socioeconomic History   Marital status: Single    Spouse name: Not on file   Number of children: Not on file   Years of education: Not on file   Highest education level: Not on file  Occupational History   Not on file  Tobacco Use   Smoking status: Every Day    Packs/day: 1.00    Types: Cigarettes   Smokeless tobacco: Never  Substance and Sexual Activity   Alcohol use: No    Comment: None in 3 years   Drug use: Yes    Types: Marijuana    Comment:  pain pills previously, none since 2019.   Sexual activity: Yes    Birth control/protection: Surgical  Other Topics Concern   Not on file  Social History Narrative   Not on file   Social Determinants of Health   Financial Resource Strain: Not on file  Food Insecurity: Not on file  Transportation Needs: Not on file  Physical Activity: Not on file  Stress: Not on file  Social Connections: Not on file    Review of Systems: Gen: Denies fever, chills, cold or flulike symptoms, presyncope, syncope. CV: Denies chest pain, palpitations. Resp: Denies dyspnea, cough.  GI:See PHI Heme: See HPI  Physical Exam: LMP 05/26/2016  General:   Alert and oriented. No distress noted. Pleasant and cooperative.  Head:  Normocephalic and atraumatic. Eyes:  Conjuctiva clear without scleral icterus. Heart:  S1, S2 present without murmurs appreciated. Lungs:  Clear to auscultation bilaterally. No wheezes, rales, or rhonchi. No distress.  Abdomen:  +BS, soft, non-tender and non-distended. No rebound or guarding. No HSM or masses noted. Msk:  Symmetrical without gross deformities. Normal posture. Extremities:  Without edema. Neurologic:  Alert and  oriented x4 Psych:  Normal mood and affect.    Assessment:     Plan:  ***   Aliene Altes, PA-C [facility] 09/02/2021

## 2021-09-01 NOTE — Telephone Encounter (Signed)
Faxed pain management referral to [facility], confirmation received 09/01/21.Patient has been notified thru voice message.

## 2021-09-02 ENCOUNTER — Ambulatory Visit: Payer: Medicare Other | Admitting: Gastroenterology

## 2021-09-02 ENCOUNTER — Telehealth: Payer: Self-pay | Admitting: *Deleted

## 2021-09-02 NOTE — Telephone Encounter (Signed)
Faxed pain management ([facility]), [facility] unable to accept because patient  is on Methadone.

## 2021-09-30 DIAGNOSIS — K529 Noninfective gastroenteritis and colitis, unspecified: Secondary | ICD-10-CM | POA: Insufficient documentation

## 2021-10-04 DIAGNOSIS — Z79899 Other long term (current) drug therapy: Secondary | ICD-10-CM | POA: Insufficient documentation

## 2021-10-04 DIAGNOSIS — M899 Disorder of bone, unspecified: Secondary | ICD-10-CM | POA: Insufficient documentation

## 2021-10-04 DIAGNOSIS — G894 Chronic pain syndrome: Secondary | ICD-10-CM | POA: Insufficient documentation

## 2021-10-04 DIAGNOSIS — Z789 Other specified health status: Secondary | ICD-10-CM | POA: Insufficient documentation

## 2021-10-04 NOTE — Progress Notes (Signed)
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 Results for orders placed during the hospital encounter of 06/10/06  DG Hand Complete Right  Narrative Clinical Data: Pain. RIGHT ELBOW - 3 VIEWS: Findings: There is no evidence of fracture, dislocation, or other significant bone abnormality. There is no evidence of joint effusion.  Impression Negative. RIGHT HAND - 3 VIEWS: Findings: There is no evidence of fracture or dislocation. No other soft tissue or bone abnormalities are identified. IMPRESSION: Negative. RIGHT WRIST - 4 VIEWS: Findings: There is no evidence of fracture or dislocation. No other soft tissue or  bone abnormalities are identified. IMPRESSION: Negative.  Provider: Mary Southard  Hand-L DG  Complete: No results found for this or any previous visit.   Complexity Note: Imaging results reviewed.                         ROS  Cardiovascular: {Hx; Cardiovascular History:210120525} Pulmonary or Respiratory: {Hx; Pumonary and/or Respiratory History:210120523} Neurological: {Hx; Neurological:210120504} Psychological-Psychiatric: {Hx; Psychological-Psychiatric History:210120512} Gastrointestinal: {Hx; Gastrointestinal:210120527} Genitourinary: {Hx; Genitourinary:210120506} Hematological: {Hx; Hematological:210120510} Endocrine: {Hx; Endocrine history:210120509} Rheumatologic: {Hx; Rheumatological:210120530} Musculoskeletal: {Hx; Musculoskeletal:210120528} Work History: {Hx; Work history:210120514}  Allergies  Ms. Propst has No Known Allergies.  Laboratory Chemistry Profile   Renal Lab Results  Component Value Date   BUN 11 02/20/2021   CREATININE 0.87 02/20/2021   BCR 13 02/20/2021   GFRAA >60 07/22/2014   GFRNONAA >60 07/22/2014   PROTEINUR NEGATIVE 07/22/2014     Electrolytes Lab Results  Component Value Date   NA 142 02/20/2021   K 4.5 02/20/2021   CL 100 02/20/2021   CALCIUM 10.0 02/20/2021     Hepatic Lab Results  Component Value Date   AST 19 06/01/2021   ALT 14 06/01/2021   ALBUMIN 4.1 06/01/2021   ALKPHOS 124 (H) 06/01/2021     ID Lab Results  Component Value Date   HIV NON-REACTIVE 12/26/2020   HCVAB REACTIVE (A) 08/31/2013   PREGTESTUR  07/06/2009    NEGATIVE        THE SENSITIVITY OF THIS METHODOLOGY IS >24 mIU/mL     Bone No results found for: "VD25OH", "VD125OH2TOT", "RC7893YB0", "FB5102HE5", "25OHVITD1", "25OHVITD2", "25OHVITD3", "TESTOFREE", "TESTOSTERONE"   Endocrine Lab Results  Component Value Date   GLUCOSE 75 02/20/2021   GLUCOSEU NEGATIVE 07/22/2014   HGBA1C 5.8 (H) 09/20/2012   TSH 3.863 08/05/2014     Neuropathy Lab Results  Component Value Date   HGBA1C 5.8 (H) 09/20/2012   HIV NON-REACTIVE 12/26/2020      CNS No results found for: "COLORCSF", "APPEARCSF", "RBCCOUNTCSF", "WBCCSF", "POLYSCSF", "LYMPHSCSF", "EOSCSF", "PROTEINCSF", "GLUCCSF", "JCVIRUS", "CSFOLI", "IGGCSF", "LABACHR", "ACETBL"   Inflammation (CRP: Acute  ESR: Chronic) No results found for: "CRP", "ESRSEDRATE", "LATICACIDVEN"   Rheumatology No results found for: "RF", "ANA", "LABURIC", "URICUR", "LYMEIGGIGMAB", "LYMEABIGMQN", "HLAB27"   Coagulation Lab Results  Component Value Date   INR 1.0 12/26/2020   LABPROT 10.3 12/26/2020   PLT 237 12/26/2020   DDIMER 0.48 08/04/2012     Cardiovascular Lab Results  Component Value Date   TROPONINI <0.30 08/04/2012   HGB 14.4 12/26/2020   HCT 44.2 12/26/2020     Screening Lab Results  Component Value Date   HCVAB REACTIVE (A) 08/31/2013   HIV NON-REACTIVE 12/26/2020   PREGTESTUR  07/06/2009    NEGATIVE        THE SENSITIVITY OF THIS METHODOLOGY IS >24 mIU/mL     Cancer No results found for: "CEA", "CA125", "LABCA2"   Allergens No results found for: "ALMOND", "APPLE", "ASPARAGUS", "AVOCADO", "BANANA", "BARLEY", "BASIL", "BAYLEAF", "GREENBEAN", "LIMABEAN", "WHITEBEAN", "BEEFIGE", "REDBEET", "BLUEBERRY", "BROCCOLI", "CABBAGE", "MELON", "CARROT", "CASEIN", "CASHEWNUT", "CAULIFLOWER", "CELERY"     Note: Lab results reviewed.  [place]  Drug: Ms. Kallstrom  reports current drug use. Drug: Marijuana. Alcohol:  reports no history of alcohol use. Tobacco:  reports that she has been smoking cigarettes. She has been smoking an average of 1 pack per day. She has never used smokeless tobacco. Medical:  has a past medical history of Anxiety, Asthmatic bronchitis, Chronic pain, Depression, Hepatitis C, and Stroke ([place]).  Family: family history includes Cancer in her father; Cirrhosis in her father; Depression in her brother and mother; Hepatitis C in her brother; Hyperlipidemia in her brother, father, mother, and sister; Hypertension in her father and mother.  Past Surgical History:   Procedure Laterality Date   CHOLECYSTECTOMY  2007   FOOT SURGERY     x3 on right and 1 on left.   KNEE SURGERY     OVARY SURGERY     right oophrectomy   TUBAL LIGATION     Active Ambulatory Problems    Diagnosis Date Noted   Anxiety state 08/17/2010   Benign essential hypertension 10/05/2011   Hyperlipidemia 10/05/2011   Poor dentition 10/05/2011   Weight loss 10/05/2011   Depression 10/05/2011   Tobacco use 10/05/2011   Leukocytosis, unspecified 09/20/2012   Hepatitis C antibody test positive 09/03/2013   Asthma with bronchitis 09/03/2013   Elevated LFTs 09/03/2013   Insomnia 11/02/2016   Chronic hepatitis C without hepatic coma ([place]) 12/26/2020   Gastroesophageal reflux disease 12/26/2020   Colon cancer screening 12/26/2020   Elevated alkaline phosphatase level 06/03/2021   Fatty liver 06/03/2021   Colitis 09/30/2021   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 In no apparent acute distress There were no vitals filed for this visit. BMI Assessment: Estimated body mass index is 27.13 kg/m as calculated from the following:   Height as of 06/03/21: 5' 7" (1.702 m).   Weight as of 06/03/21: 173 lb 3.2 oz (78.6 kg).  BMI interpretation table: BMI level Category Range association with higher incidence of chronic pain  <18 kg/m2 Underweight   18.5-24.9 kg/m2 Ideal  body weight   25-29.9 kg/m2 Overweight Increased incidence by 20%  30-34.9 kg/m2 Obese (Class I) Increased incidence by 68%  35-39.9 kg/m2 Severe obesity (Class II) Increased incidence by 136%  >40 kg/m2 Extreme obesity (Class III) Increased incidence by 254%   Patient's current BMI Ideal Body weight  There is no height or weight on file to calculate BMI. Patient weight not recorded   BMI Readings from Last 4 Encounters:  06/03/21 27.13 kg/m  12/26/20 26.66 kg/m  06/28/17 16.76 kg/m  11/02/16 16.57 kg/m   Wt Readings from Last 4 Encounters:  06/03/21 173 lb 3.2 oz (78.6 kg)  12/26/20 170 lb 3.2 oz (77.2 kg)  06/28/17 107 lb (48.5 kg)  11/02/16 105 lb 12.8 oz (48 kg)    Psych/Mental status: Alert, oriented x 3 (person, place, & time)       Eyes: PERLA Respiratory: No evidence of acute respiratory distress  Assessment  Primary Diagnosis & Pertinent Problem List: The primary encounter diagnosis was Chronic pain syndrome. Diagnoses of Pharmacologic therapy, Disorder of skeletal system, and Problems influencing health status were also pertinent to this visit.  Visit Diagnosis (New problems to examiner): 1. Chronic pain syndrome   2. Pharmacologic  therapy   3. Disorder of skeletal system   4. Problems influencing health status    Plan of Care (Initial workup plan)  Note: Ms. Frane was reminded that as per protocol, today's visit has been an evaluation only. We have not taken over the patient's controlled substance management.  Problem-specific plan: No problem-specific Assessment & Plan notes found for this encounter.  Lab Orders  No laboratory test(s) ordered today   Imaging Orders  No imaging studies ordered today   Referral Orders  No referral(s) requested today   Procedure Orders    No procedure(s) ordered today   Pharmacotherapy (current): Medications ordered:  No orders of the defined types were placed in this encounter.  Medications administered during  this visit: Nyja L. Kirstein had no medications administered during this visit.   Pharmacological management options:  Opioid Analgesics: The patient was informed that there is no guarantee that she would be a candidate for opioid analgesics. The decision will be made following CDC guidelines. This decision will be based on the results of diagnostic studies, as well as Ms. Hreha's risk profile.   Membrane stabilizer: To be determined at a later time  Muscle relaxant: To be determined at a later time  NSAID: To be determined at a later time  Other analgesic(s): To be determined at a later time   Interventional management options: Ms. Hill was informed that there is no guarantee that she would be a candidate for interventional therapies. The decision will be based on the results of diagnostic studies, as well as Ms. Connaughton's risk profile.  Procedure(s) under consideration:  Pending results of ordered studies      Interventional Therapies  Risk  Complexity Considerations:   Estimated body mass index is 27.13 kg/m as calculated from the following:   Height as of 06/03/21: 5' 7" (1.702 m).   Weight as of 06/03/21: 173 lb 3.2 oz (78.6 kg). WNL   Planned  Pending:   Pending further evaluation   Under consideration:   ***   Completed:   None at this time   Completed by other providers:   None at this time   Therapeutic  Palliative (PRN) options:   None established      Provider-requested follow-up: No follow-ups on file.  Future Appointments  Date Time Provider [place]  10/07/2021  8:20 AM Milinda Pointer, MD [facility] None  10/14/2021  1:45 PM Felipa Furnace, DPM [facility]    Note by: Gaspar Cola, MD Date: 10/07/2021; Time: 9:22 AM

## 2021-10-07 ENCOUNTER — Ambulatory Visit (HOSPITAL_BASED_OUTPATIENT_CLINIC_OR_DEPARTMENT_OTHER): Payer: Medicare Other | Admitting: Pain Medicine

## 2021-10-07 DIAGNOSIS — Z789 Other specified health status: Secondary | ICD-10-CM

## 2021-10-07 DIAGNOSIS — Z91199 Patient's noncompliance with other medical treatment and regimen due to unspecified reason: Secondary | ICD-10-CM

## 2021-10-07 DIAGNOSIS — M899 Disorder of bone, unspecified: Secondary | ICD-10-CM

## 2021-10-07 DIAGNOSIS — G894 Chronic pain syndrome: Secondary | ICD-10-CM

## 2021-10-07 DIAGNOSIS — Z79899 Other long term (current) drug therapy: Secondary | ICD-10-CM

## 2021-10-14 ENCOUNTER — Ambulatory Visit: Payer: Medicare Other | Admitting: Podiatry

## 2021-11-12 ENCOUNTER — Encounter: Payer: Self-pay | Admitting: *Deleted

## 2021-11-12 ENCOUNTER — Ambulatory Visit (INDEPENDENT_AMBULATORY_CARE_PROVIDER_SITE_OTHER): Payer: Medicare Other | Admitting: Gastroenterology

## 2021-11-12 ENCOUNTER — Telehealth: Payer: Self-pay | Admitting: *Deleted

## 2021-11-12 ENCOUNTER — Encounter: Payer: Self-pay | Admitting: Gastroenterology

## 2021-11-12 VITALS — BP 138/83 | HR 78 | Temp 98.0°F | Ht 67.0 in | Wt 159.4 lb

## 2021-11-12 DIAGNOSIS — K529 Noninfective gastroenteritis and colitis, unspecified: Secondary | ICD-10-CM

## 2021-11-12 DIAGNOSIS — Z1211 Encounter for screening for malignant neoplasm of colon: Secondary | ICD-10-CM

## 2021-11-12 DIAGNOSIS — B182 Chronic viral hepatitis C: Secondary | ICD-10-CM | POA: Diagnosis not present

## 2021-11-12 DIAGNOSIS — K760 Fatty (change of) liver, not elsewhere classified: Secondary | ICD-10-CM | POA: Diagnosis not present

## 2021-11-12 DIAGNOSIS — K219 Gastro-esophageal reflux disease without esophagitis: Secondary | ICD-10-CM

## 2021-11-12 MED ORDER — PEG 3350-KCL-NA BICARB-NACL 420 G PO SOLR: 4000.0000 mL | Freq: Once | ORAL | 0 refills | Status: AC

## 2021-11-12 MED ORDER — PANTOPRAZOLE SODIUM 20 MG PO TBEC: 20.0000 mg | DELAYED_RELEASE_TABLET | Freq: Every day | ORAL | 3 refills | Status: DC

## 2021-11-12 NOTE — Patient Instructions (Signed)
I am ordering follow-up labs for hepatitis C to make sure you remain virus free.  I have refilled your pantoprazole 20 mg daily.  Follow a GERD diet:  Avoid fried, fatty, greasy, spicy, citrus foods. Avoid caffeine and carbonated beverages. Avoid chocolate. Try eating 4-6 small meals a day rather than 3 large meals. Do not eat within 3 hours of laying down. Prop head of bed up on wood or bricks to create a 6 inch incline.  Consider adding extra fiber in her diet to help you with your stools.  Avoiding higher fat containing foods should help with some of your stools post meals.  You did receive Flagyl which is known to cause looser stools and GI upset.  This should improve over time.  We are scheduling you for colonoscopy in the near future with Dr. Abbey Chatters.  No sooner than the end of October.   Follow-up post procedure with Vicente Males.   It was a pleasure to see you today. I want to create trusting relationships with patients. If you receive a survey regarding your visit,  I greatly appreciate you taking time to fill this out on paper or through your MyChart. I value your feedback.  Venetia Night, MSN, FNP-BC, AGACNP-BC [facility]

## 2021-11-12 NOTE — Progress Notes (Signed)
Cv rna  GI Office Note    Referring Provider: Denny Levy, [place] Primary Care Physician:  Denny Levy, [place] Primary Gastroenterologist: Dr. Abbey Chatters (previously Dr. Oneida Alar)  Date:  11/12/2021  ID:  Fredia Sorrow, DOB [birth year], MRN [id number]   Chief Complaint   Chief Complaint  Patient presents with   Follow-up    History of Present Illness  Brooke Woods is a 51 y.o. female with a history of chronic hepatitis C, elevated LFTs, anxiety, asthma, depression and TIA in 2012 presenting today for follow-up.  History of chronic hepatitis C, genotype Ia detected in 2015 with RNA 1,190,000 and November 2022.  Ultrasound with elastography without cirrhosis and median K PA 7.  She had increased hepatic echogenicity and dilated CBD to 15 mm in the setting of postcholecystectomy.  MRI/MRCP with extrahepatic biliary duct dilation to the level of ampulla without choledocholithiasis or obstructing mass.  She was started on Epclusa on 01/23/2021 with undetectable RNA January 2023 with normalized LFTs.  Treatment end of March revealed HCV RNA not detected.  RNA also not detected with labs in April 2023.    Last seen in the office in April 2023.  Reportedly was doing well.  Happy that she completed her hepatitis C treatment with Epclusa.  She denies any abdominal pain, constipation, diarrhea, BRBPR, melena, weight loss, nausea or vomiting.  She did report struggling with heartburn that started returning age 43 and reported it was becoming more frequent, occurring at least 3 days a week.  He was not eating spicy or a lot of fried/greasy foods.  Did admit to drinking Dr. Malachi Bonds daily.  Declined colonoscopy, reports she had Cologuard at home. Advised repeat HCVRNA in July 2023, advised to complete hepatitis a and B vaccinations.  She was started on Protonix 20 mg daily 30 minutes prior to breakfast.  Advised to complete Cologuard.  Diet and exercise reinforced.  HCVRNA not performed in July as  ordered.  ED visit for nausea vomiting at [facility] on 09/30/2021.  She had some leukocytosis, potassium 2.9, lipase 495.  CT A/P with collapsed colon, Appears to show mild wall thickening/edema suggesting mild colitis, previous evidence of cholecystectomy.  She was treated with antiemetics, IV fluids, Cipro and Flagyl for 10 days.  She was recommended to have colonoscopy in 6 weeks follow-up with PCP in 1-2 weeks.   Today: Has been going to pain clinic and was prescribed suboxone and then ended up in the ED for that. Has an appointment with a new pain doctor coming up.   Took all of her antibiotics. She notices some urgency to defecate after meals over the last couple of weeks. Reports she finished the antibiotics a couple of weeks ago.Pain was lower abdominal pain. No current nausea or vomiting. Has lost about 20 lbs since she quit taking methadone. No lack of appetite or early satiety. No melena or BRBPR. Denies reflux, taking pantoprazole with no breakthrough symptoms. No dysphagia. No abdominal pain. No issues with colitis in the past. No prior colonoscopy. No chest pain or shortness of breath.    Current Outpatient Medications  Medication Sig Dispense Refill   albuterol (PROVENTIL HFA;VENTOLIN HFA) 108 (90 Base) MCG/ACT inhaler Inhale 2 puffs into the lungs every 4 (four) hours as needed for wheezing. 1 Inhaler 3   atorvastatin (LIPITOR) 20 MG tablet Take by mouth.     Multiple Vitamin (MULTIVITAMIN) tablet Take 1 tablet by mouth daily.     pantoprazole (PROTONIX) 20 MG tablet Take 1  tablet (20 mg total) by mouth daily before breakfast. 30 tablet 3   No current facility-administered medications for this visit.    Past Medical History:  Diagnosis Date   Anxiety    Asthmatic bronchitis    Chronic pain    Depression    Hepatitis C    genoptype 1 a, s/p treatment with Epclusa.   Stroke [facility])    Pt reported TIA 04/27/10    Past Surgical History:  Procedure Laterality Date    CHOLECYSTECTOMY  2007   FOOT SURGERY     x3 on right and 1 on left.   KNEE SURGERY     OVARY SURGERY     right oophrectomy   TUBAL LIGATION      Family History  Problem Relation Age of Onset   Hypertension Mother    Hyperlipidemia Mother    Depression Mother    Cancer Father    Hypertension Father    Hyperlipidemia Father    Cirrhosis Father        alcoholic   Hyperlipidemia Sister    Hyperlipidemia Brother    Depression Brother    Hepatitis C Brother    Colon cancer Neg Hx     Allergies as of 11/12/2021   (No Known Allergies)    Social History   Socioeconomic History   Marital status: Single    Spouse name: Not on file   Number of children: Not on file   Years of education: Not on file   Highest education level: Not on file  Occupational History   Not on file  Tobacco Use   Smoking status: Every Day    Packs/day: 1.00    Types: Cigarettes   Smokeless tobacco: Never  Substance and Sexual Activity   Alcohol use: No    Comment: None in 3 years   Drug use: Yes    Types: Marijuana    Comment: pain pills previously, none since 2019.   Sexual activity: Yes    Birth control/protection: Surgical  Other Topics Concern   Not on file  Social History Narrative   Not on file   Social Determinants of Health   Financial Resource Strain: Not on file  Food Insecurity: Not on file  Transportation Needs: Not on file  Physical Activity: Not on file  Stress: Not on file  Social Connections: Not on file     Review of Systems   Gen: Denies fever, chills, anorexia. Denies fatigue, weakness, weight loss.  CV: Denies chest pain, palpitations, syncope, peripheral edema, and claudication. Resp: Denies dyspnea at rest, cough, wheezing, coughing up blood, and pleurisy. GI: See HPI Derm: Denies rash, itching, dry skin Psych: Denies depression, anxiety, memory loss, confusion. No homicidal or suicidal ideation.  Heme: Denies bruising, bleeding, and enlarged lymph  nodes.   Physical Exam   BP 138/83 (BP Location: Right Arm, Patient Position: Sitting, Cuff Size: Normal)   Pulse 78   Temp 98 F (36.7 C) (Oral)   Ht '5\' 7"'  (1.702 m)   Wt 159 lb 6.4 oz (72.3 kg)   LMP 05/26/2016   SpO2 99%   BMI 24.97 kg/m   General:   Alert and oriented. No distress noted. Pleasant and cooperative.  Head:  Normocephalic and atraumatic. Eyes:  Conjuctiva clear without scleral icterus. Mouth:  Oral mucosa pink and moist. Good dentition. No lesions. Lungs:  Clear to auscultation bilaterally. No wheezes, rales, or rhonchi. No distress.  Heart:  S1, S2 present without murmurs appreciated.  Abdomen:  +BS, soft, non-tender and non-distended. No rebound or guarding. No HSM or masses noted. Rectal: Deferred Msk:  Symmetrical without gross deformities. Normal posture. Extremities:  Without edema. Neurologic:  Alert and  oriented x4 Psych:  Alert and cooperative. Normal mood and affect.   Assessment  Brooke Woods is a 51 y.o. female with a history of chronic hepatitis C, elevated LFTs, anxiety, asthma, depression and TIA in 2012 presenting today to follow-up on ED visit at outside hospital regarding colitis, follow-up of other GI issues.  Chronic hepatitis C: Genotype Ia detected in 2015.  Ultrasound with elastography without cirrhosis and median K PA 7.  MRI/MRCP performed due to CBD measuring 15 mm.  This revealed a dramatic biliary ductal dilation to the level of the ampulla without choledocholithiasis or obstructing mass.  She treated with Epclusa starting 01/23/2021 with undetectable RNA in January 2023 with normalized LFTs.  HCVRNA not detected in March at the end of treatment.  Was due for follow-up in July 2023.  GERD: Well controlled with pantoprazole 20 mg daily.  Denies any dysphagia.  Fatty liver: Noted on ultrasound in November 2022 with previously elevated transaminases in the setting of hepatitis C.  Also prior elevations in alk phos. Most recent labs  completed 10/01/2021 with normal LFTs and alk phos. need to follow-up HCVRNA for documentation of SVR 12 weeks posttreatment.  Recent Colitis: Recent ED visit 09/27/2021 at outside hospital for nausea and vomiting.  She presented with leukocytosis, potassium 2.9, and elevated lipase.  She has CT A/P with collapse:, And mild wall thickening/edema suggesting mild colitis.  She was treated with IV fluids, antiemetics, Cipro and Flagyl for 10 days and was told that she should follow-up with her PCP in 1-2 weeks and to consider colonoscopy in 6 weeks.  Currently not having any abdominal pain, constipation, diarrhea, or nausea/vomiting.  We will proceed with colonoscopy in near future with Dr. Abbey Chatters.  Screening for colon cancer: Per review of records, patient was to complete Cologuard at home and previously declined colonoscopy.  She denies any family history of colon cancer.  Per review of chart records, it appears that Cologuard has not been completed.  She denies any lack of appetite, early satiety, melena, BRBPR, abdominal pain, dysphagia, nausea/vomiting, or constipation.  She does report about a 20 pound weight loss since she quit taking methadone.  We will proceed with colonoscopy in the near future now given recent colitis.   PLAN   Continue pantoprazole 20 mg daily.  HCV RNA Proceed with colonoscopy with propofol by Dr. Abbey Chatters  in near future: the risks, benefits, and alternatives have been discussed with the patient in detail. The patient states understanding and desires to proceed. ASA 2 Follow up as needed.   Venetia Night, MSN, FNP-BC, AGACNP-BC [facility]

## 2021-11-12 NOTE — Telephone Encounter (Signed)
PA: APPROVED Authorization #: [id number]  DOS: 12/22/21-02/07/22

## 2021-11-15 LAB — HCV RNA,QUANTITATIVE REAL TIME PCR
HCV Quantitative Log: <1.18 Log IU/mL
HCV RNA, PCR, QN: <15 IU/mL

## 2021-11-15 LAB — HEPATITIS C ANTIBODY: Hepatitis C Ab: REACTIVE — AB
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
## 2021-11-30 ENCOUNTER — Ambulatory Visit (HOSPITAL_BASED_OUTPATIENT_CLINIC_OR_DEPARTMENT_OTHER): Payer: Medicare Other | Admitting: Pain Medicine

## 2021-11-30 DIAGNOSIS — G894 Chronic pain syndrome: Secondary | ICD-10-CM

## 2021-11-30 DIAGNOSIS — Z79899 Other long term (current) drug therapy: Secondary | ICD-10-CM

## 2021-11-30 DIAGNOSIS — Z91199 Patient's noncompliance with other medical treatment and regimen due to unspecified reason: Secondary | ICD-10-CM

## 2021-11-30 DIAGNOSIS — M899 Disorder of bone, unspecified: Secondary | ICD-10-CM

## 2021-11-30 DIAGNOSIS — Z789 Other specified health status: Secondary | ICD-10-CM

## 2021-11-30 NOTE — Patient Instructions (Signed)
____________________________________________________________________________________________  New Patients  Welcome to [facility] [place].   Initial Visit The first or initial visit consists of an evaluation only.   Interventional pain management.  We offer therapies other than opioid controlled substances to manage chronic pain. These include, but are not limited to, diagnostic, therapeutic, and palliative specialized injection therapies (i.e.: Epidural Steroids, Facet Blocks, etc.). We specialize in a variety of nerve blocks as well as radiofrequency treatments. We offer pain implant evaluations and trials, as well as follow up management. In addition we also provide a variety joint injections, including Viscosupplementation (AKA: Gel Therapy).  Prescription Pain Medication We provide evaluations for/of pharmacologic therapies. Recommendations will follow CDC Guidelines.  We no longer take patients for long-term medication management. We will not be taking over your pain medications.  ____________________________________________________________________________________________  ____________________________________________________________________________________________  Patient Information update  To: All of our patients.  Re: Name change.  It has been made official that our current name, "[facility]"   will soon be changed "[facility]".   The purpose of this change is to eliminate any confusion created by the concept of our practice being a "Pain Clinic". In the past this has led to the misconception that we treat pain primarily by the use of prescription medications.  Nothing can be farther from the truth.   Understanding PAIN MANAGEMENT: To further understand what our practice does, you first have to understand that  "Pain Management" is a subspecialty that requires additional training once a physician has completed their specialty training, which can be in either Anesthesia, Neurology, Psychiatry, or Physical Medicine and Rehabilitation (PMR). Each one of these contributes to the final approach taken by each physician to the management of their patient's pain. To be a "Pain Management Specialist" you must have completed one of the specialty trainings below.  Anesthesiologists are trained in clinical pharmacology and interventional techniques such as nerve blockade and regional as well as central neuroanatomy. They are trained to block pain before, during, and after surgical interventions.  Neurologists are trained in the diagnosis and pharmacological treatment of complex neurological conditions, such as Multiple Sclerosis, Parkinson's, spinal cord injuries, and other systemic conditions that may be associated with symptoms that may include but are not limited to pain. They tend to rely primarily on the treatment of chronic pain using prescription medications.  Psychiatrist are trained in conditions affecting the psychosocial wellbeing of patients including but not limited to depression, anxiety, schizophrenia, personality disorders, addiction, and other substance use disorders that may be associated with chronic pain. They tend to rely primarily on the treatment of chronic pain using prescription medications.   Physical Medicine and Rehabilitation (PMR) physicians, also known as physiatrists, treat a wide variety of medical conditions affecting the brain, spinal cord, nerves, bones, joints, ligaments, muscles, and tendons. Their training is primarily aimed at treating patients that have suffered injuries that have caused severe physical impairment. They are trained in the physical therapy and rehabilitation of those patients. They may also work alongside orthopedic surgeons or neurosurgeons using their expertise in  [place] patients to recover after their surgery.  INTERVENTIONAL PAIN MANAGEMENT is s sub-subspecialty of Pain Management.  Our physicians are Board-certified in Anesthesia, Pain Management, and Interventional Pain Management.  This meaning that not only have they been trained and Board-certified in their specialty of Anesthesia, subspecialty of Pain Management, but they have also received further training in  the sub-subspecialty of Interventional Pain Management, in order to be Board-certified as INTERVENTIONAL PAIN MANAGEMENT SPECIALIST.    Mission: Our goal is to use INTERVENTIONAL PAIN MANAGEMENT techniques as an alternative option to the chronic use of prescription medication for the treatment of pain. To make this clear, we have changed our name and we will no longer be taking patients for medication management. We will continue to offer medication management assessment and recommendations, but we will not be taking over any patient's medication regimen.   ____________________________________________________________________________________________

## 2021-12-21 NOTE — Progress Notes (Signed)
Called pt regarding colonoscopy scheduled tomorrow.  Pt stated she tried to call office this morning but did not get an answer.  States she needs to cancel / reschedule colonoscopy.  States she has some other medical issues going on which she needs to see PCP for before having colonoscopy. Message sent to [facility] [address] office.

## 2021-12-22 ENCOUNTER — Ambulatory Visit (HOSPITAL_COMMUNITY): Admission: RE | Admit: 2021-12-22 | Payer: Medicare Other | Source: Home / Self Care

## 2021-12-22 ENCOUNTER — Encounter (HOSPITAL_COMMUNITY): Admission: RE | Payer: Self-pay | Source: Home / Self Care

## 2021-12-22 ENCOUNTER — Telehealth: Payer: Self-pay | Admitting: *Deleted

## 2021-12-22 SURGERY — COLONOSCOPY WITH PROPOFOL
Anesthesia: Monitor Anesthesia Care

## 2021-12-22 NOTE — Telephone Encounter (Signed)
Received call patient wanted to cancel procedure for today. Did not know when she could reschedule. Procedure cancelled

## 2022-03-02 DIAGNOSIS — G8929 Other chronic pain: Secondary | ICD-10-CM | POA: Diagnosis not present

## 2022-03-02 DIAGNOSIS — F17210 Nicotine dependence, cigarettes, uncomplicated: Secondary | ICD-10-CM | POA: Diagnosis not present

## 2022-03-02 DIAGNOSIS — Z6823 Body mass index (BMI) 23.0-23.9, adult: Secondary | ICD-10-CM | POA: Diagnosis not present

## 2022-03-09 DIAGNOSIS — M5451 Vertebrogenic low back pain: Secondary | ICD-10-CM | POA: Diagnosis not present

## 2022-03-09 DIAGNOSIS — Z79891 Long term (current) use of opiate analgesic: Secondary | ICD-10-CM | POA: Diagnosis not present

## 2022-03-09 DIAGNOSIS — Z79899 Other long term (current) drug therapy: Secondary | ICD-10-CM | POA: Diagnosis not present

## 2022-03-09 DIAGNOSIS — M5416 Radiculopathy, lumbar region: Secondary | ICD-10-CM | POA: Diagnosis not present

## 2022-03-09 DIAGNOSIS — G894 Chronic pain syndrome: Secondary | ICD-10-CM | POA: Diagnosis not present

## 2022-04-06 DIAGNOSIS — M5416 Radiculopathy, lumbar region: Secondary | ICD-10-CM | POA: Diagnosis not present

## 2022-04-06 DIAGNOSIS — Z79899 Other long term (current) drug therapy: Secondary | ICD-10-CM | POA: Diagnosis not present

## 2022-04-06 DIAGNOSIS — Z79891 Long term (current) use of opiate analgesic: Secondary | ICD-10-CM | POA: Diagnosis not present

## 2022-04-06 DIAGNOSIS — M5451 Vertebrogenic low back pain: Secondary | ICD-10-CM | POA: Diagnosis not present

## 2022-04-06 DIAGNOSIS — G894 Chronic pain syndrome: Secondary | ICD-10-CM | POA: Diagnosis not present

## 2023-02-25 NOTE — Progress Notes (Deleted)
GI Office Note    Referring Provider: Lawerance Sabal, [place] Primary Care Physician:  Lawerance Sabal, [place] Primary Gastroenterologist: Hennie Duos. Marletta Lor, DO  Date:  02/25/2023  ID:  Brooke Woods, DOB Oct 03, 1970, MRN [id number]   Chief Complaint   No chief complaint on file.  History of Present Illness  Brooke Woods is a 53 y.o. female with a history of colitis in 2023, chronic hepatitis C s/p treatment with Epclusa and documented SVR, elevated LFTs, anxiety, asthma, depression and TIA in 2012 presenting today with abdominal pain. ***   OV in April 2023.  Reportedly was doing well.  Happy that she completed her hepatitis C treatment with Epclusa.  She denies any abdominal pain, constipation, diarrhea, BRBPR, melena, weight loss, nausea or vomiting.  She did report struggling with heartburn that started returning age 36 and reported it was becoming more frequent, occurring at least 3 days a week.  He was not eating spicy or a lot of fried/greasy foods.  Did admit to drinking Dr. Reino Kent daily.  Declined colonoscopy, reports she had Cologuard at home. Advised repeat HCVRNA in July 2023, advised to complete hepatitis a and B vaccinations.  She was started on Protonix 20 mg daily 30 minutes prior to breakfast.  Advised to complete Cologuard.  Diet and exercise reinforced.   ED visit for nausea vomiting at [facility] on 09/30/2021.  She had some leukocytosis, potassium 2.9, lipase 495.  CT A/P with collapsed colon, appeared to show mild wall thickening/edema suggesting mild colitis, previous evidence of cholecystectomy.  She was treated with antiemetics, IV fluids, Cipro and Flagyl for 10 days.  She was recommended to have colonoscopy in 6 weeks follow-up with PCP in 1-2 weeks.   Last office visit 11/12/21.  Follow with pain clinic as she was getting Suboxone.  Given her chronic pain she has been to the ED and was to have a new pain doctor soon.  Reports she took all of her antibiotics from her ED visit  and has some postprandial urgency for a few weeks.  Not currently having any abdominal pain, was previously in the lower abdomen.  Nausea and vomiting resolved.  Reported 20 pound weight loss since stopped taking methadone.  She denied issues with colitis in the past.  Never had a colonoscopy. Advised to continue pantoprazole 20 mg once daily.  HCVRNA.  Colonoscopy ordered.  HCVRNA not detected in October 2023.  Patient eventually canceled procedure and did not call to reschedule.    Today:    Wt Readings from Last 3 Encounters:  11/12/21 159 lb 6.4 oz (72.3 kg)  06/03/21 173 lb 3.2 oz (78.6 kg)  12/26/20 170 lb 3.2 oz (77.2 kg)    Current Outpatient Medications  Medication Sig Dispense Refill   aspirin EC 81 MG tablet Take 81 mg by mouth daily. Swallow whole.     atorvastatin (LIPITOR) 20 MG tablet Take 20 mg by mouth at bedtime.     ondansetron (ZOFRAN-ODT) 4 MG disintegrating tablet Take 4 mg by mouth every 8 (eight) hours as needed for nausea or vomiting.     pantoprazole (PROTONIX) 20 MG tablet Take 1 tablet (20 mg total) by mouth daily before breakfast. 30 tablet 3   traMADol (ULTRAM) 50 MG tablet Take 50 mg by mouth 2 (two) times daily.     No current facility-administered medications for this visit.    Past Medical History:  Diagnosis Date   Anxiety    Asthmatic bronchitis  Chronic pain    Depression    Hepatitis C    genoptype 1 a, s/p treatment with Epclusa.   Stroke [facility])    Pt reported TIA 04/27/10    Past Surgical History:  Procedure Laterality Date   CHOLECYSTECTOMY  2007   FOOT SURGERY     x3 on right and 1 on left.   KNEE SURGERY     OVARY SURGERY     right oophrectomy   TUBAL LIGATION      Family History  Problem Relation Age of Onset   Hypertension Mother    Hyperlipidemia Mother    Depression Mother    Cancer Father    Hypertension Father    Hyperlipidemia Father    Cirrhosis Father        alcoholic   Hyperlipidemia Sister     Hyperlipidemia Brother    Depression Brother    Hepatitis C Brother    Colon cancer Neg Hx     Allergies as of 02/28/2023   (No Known Allergies)    Social History   Socioeconomic History   Marital status: Single    Spouse name: Not on file   Number of children: Not on file   Years of education: Not on file   Highest education level: Not on file  Occupational History   Not on file  Tobacco Use   Smoking status: Every Day    Current packs/day: 1.00    Types: Cigarettes   Smokeless tobacco: Never  Substance and Sexual Activity   Alcohol use: No    Comment: None in 3 years   Drug use: Yes    Types: Marijuana    Comment: pain pills previously, none since 2019.   Sexual activity: Yes    Birth control/protection: Surgical  Other Topics Concern   Not on file  Social History Narrative   Not on file   Social Drivers of Health   Financial Resource Strain: Not on file  Food Insecurity: Not on file  Transportation Needs: Not on file  Physical Activity: Not on file  Stress: Not on file  Social Connections: Not on file     Review of Systems   Gen: Denies fever, chills, anorexia. Denies fatigue, weakness, weight loss.  CV: Denies chest pain, palpitations, syncope, peripheral edema, and claudication. Resp: Denies dyspnea at rest, cough, wheezing, coughing up blood, and pleurisy. GI: See HPI Derm: Denies rash, itching, dry skin Psych: Denies depression, anxiety, memory loss, confusion. No homicidal or suicidal ideation.  Heme: Denies bruising, bleeding, and enlarged lymph nodes.  Physical Exam   LMP 05/26/2016   General:   Alert and oriented. No distress noted. Pleasant and cooperative.  Head:  Normocephalic and atraumatic. Eyes:  Conjuctiva clear without scleral icterus. Mouth:  Oral mucosa pink and moist. Good dentition. No lesions. Lungs:  Clear to auscultation bilaterally. No wheezes, rales, or rhonchi. No distress.  Heart:  S1, S2 present without murmurs  appreciated.  Abdomen:  +BS, soft, non-tender and non-distended. No rebound or guarding. No HSM or masses noted. Rectal: *** Msk:  Symmetrical without gross deformities. Normal posture. Extremities:  Without edema. Neurologic:  Alert and  oriented x4 Psych:  Alert and cooperative. Normal mood and affect.  Assessment  Brooke Woods is a 53 y.o. female with a history of colitis in 2023, chronic hepatitis C s/p treatment and documented SVR, elevated LFTs, anxiety, asthma, depression and TIA in 2012 presenting today with abdominal pain. ***  History of colitis, abdominal  pain***:  History of hepatitis C, fatty liver:  Screening for colon cancer:  PLAN   ***     Brooke Bonito, MSN, FNP-BC, AGACNP-BC [facility]

## 2023-02-28 ENCOUNTER — Encounter: Payer: Self-pay | Admitting: Gastroenterology

## 2023-02-28 ENCOUNTER — Ambulatory Visit: Payer: 59 | Admitting: Gastroenterology

## 2023-03-15 NOTE — Progress Notes (Signed)
 GI Office Note    Referring Provider: Job Bolt, [place] Primary Care Physician:  Job Bolt, [place] Primary Gastroenterologist: Carlin POUR. Cindie, DO  Date:  03/17/2023  ID:  Brooke Woods, DOB 06-May-1970, MRN [id number]   Chief Complaint   Chief Complaint  Patient presents with   Follow-up    Follow up. Wants to make sure she does not have colitis    History of Present Illness  Brooke Woods is a 53 y.o. female with a history of colitis in 2023, chronic hepatitis C s/p treatment with Epclusa and documented SVR, elevated LFTs, anxiety, asthma, depression and TIA in 2012 presenting today for follow-up to ensure she does not have colitis.   OV in April 2023.  Reportedly was doing well.  Happy that she completed her hepatitis C treatment with Epclusa.  She denies any abdominal pain, constipation, diarrhea, BRBPR, melena, weight loss, nausea or vomiting.  She did report struggling with heartburn that started returning age 35 and reported it was becoming more frequent, occurring at least 3 days a week.  He was not eating spicy or a lot of fried/greasy foods.  Did admit to drinking Dr. Nunzio daily.  Declined colonoscopy, reports she had Cologuard at home. Advised repeat HCVRNA in July 2023, advised to complete hepatitis a and B vaccinations.  She was started on Protonix  20 mg daily 30 minutes prior to breakfast.  Advised to complete Cologuard.  Diet and exercise reinforced.   ED visit for nausea vomiting at [facility] on 09/30/2021.  She had some leukocytosis, potassium 2.9, lipase 495.  CT A/P with collapsed colon, appeared to show mild wall thickening/edema suggesting mild colitis, previous evidence of cholecystectomy.  She was treated with antiemetics, IV fluids, Cipro  and Flagyl for 10 days.  She was recommended to have colonoscopy in 6 weeks follow-up with PCP in 1-2 weeks.   Last office visit 11/12/21.  Follow with pain clinic as she was getting Suboxone.  Given her chronic pain she has been  to the ED and was to have a new pain doctor soon.  Reports she took all of her antibiotics from her ED visit and has some postprandial urgency for a few weeks.  Not currently having any abdominal pain, was previously in the lower abdomen.  Nausea and vomiting resolved.  Reported 20 pound weight loss since stopped taking methadone.  She denied issues with colitis in the past.  Never had a colonoscopy. Advised to continue pantoprazole  20 mg once daily.  HCVRNA.  Colonoscopy ordered.   HCVRNA not detected in October 2023.   Patient eventually canceled procedure and did not call to reschedule.  Today:  Has had some mild intermittent stomach pains, sometimes daily and then once a week. Sometimes with eating an orange she can have burning for 2 days. At times she has a sharp gas pain.  She denies any nausea, vomiting, or dysphagia.  No odynophagia.  No melena or brbrp. Having BM about every other day, has soft formed stool and not straining. Has a good appetite. She is back to where she is used to being for her weight.  Denies any diarrhea or overt changes in bowel habits.  Her intermittent lower abdominal pain does improve after defecation, she describes it as a cramping just prior to needing to use the bathroom.  No nocturnal stools.  Has not been to suboxone clinic since 2023.  Still working on a chronic pain medication. 1 ppday. No alcohol. THC now - daily.  We discussed her weight loss today.  She reports she has been thin all of her life, her previous weight gain was an anomaly for her.  She reports a good appetite.  Since she was last here she had a cologuard that she reports was abnormal.  No hysterectomy. Post menopausal.   Wt Readings from Last 8 Encounters:  03/17/23 113 lb 9.6 oz (51.5 kg)  11/12/21 159 lb 6.4 oz (72.3 kg)  06/03/21 173 lb 3.2 oz (78.6 kg)  12/26/20 170 lb 3.2 oz (77.2 kg)  06/28/17 107 lb (48.5 kg)  11/02/16 105 lb 12.8 oz (48 kg)  06/19/16 110 lb (49.9 kg)   04/23/15 111 lb (50.3 kg)    Current Outpatient Medications  Medication Sig Dispense Refill   pantoprazole  (PROTONIX ) 20 MG tablet Take 1 tablet (20 mg total) by mouth daily before breakfast. 30 tablet 3   aspirin  EC 81 MG tablet Take 81 mg by mouth daily. Swallow whole. (Patient not taking: Reported on 03/17/2023)     atorvastatin (LIPITOR) 20 MG tablet Take 20 mg by mouth at bedtime. (Patient not taking: Reported on 03/17/2023)     ondansetron  (ZOFRAN -ODT) 4 MG disintegrating tablet Take 4 mg by mouth every 8 (eight) hours as needed for nausea or vomiting. (Patient not taking: Reported on 03/17/2023)     traMADol  (ULTRAM ) 50 MG tablet Take 50 mg by mouth 2 (two) times daily. (Patient not taking: Reported on 03/17/2023)     No current facility-administered medications for this visit.    Past Medical History:  Diagnosis Date   Anxiety    Asthmatic bronchitis    Chronic pain    Depression    Hepatitis C    genoptype 1 a, s/p treatment with Epclusa.   Stroke [facility])    Pt reported TIA 04/27/10    Past Surgical History:  Procedure Laterality Date   CHOLECYSTECTOMY  2007   FOOT SURGERY     x3 on right and 1 on left.   KNEE SURGERY     OVARY SURGERY     right oophrectomy   TUBAL LIGATION      Family History  Problem Relation Age of Onset   Hypertension Mother    Hyperlipidemia Mother    Depression Mother    Cancer Father    Hypertension Father    Hyperlipidemia Father    Cirrhosis Father        alcoholic   Hyperlipidemia Sister    Hyperlipidemia Brother    Depression Brother    Hepatitis C Brother    Colon cancer Neg Hx     Allergies as of 03/17/2023   (No Known Allergies)    Social History   Socioeconomic History   Marital status: Single    Spouse name: Not on file   Number of children: Not on file   Years of education: Not on file   Highest education level: Not on file  Occupational History   Not on file  Tobacco Use   Smoking status: Every Day    Current  packs/day: 1.00    Types: Cigarettes   Smokeless tobacco: Never  Substance and Sexual Activity   Alcohol use: No    Comment: None in 3 years   Drug use: Yes    Types: Marijuana    Comment: pain pills previously, none since 2019.   Sexual activity: Yes    Birth control/protection: Surgical  Other Topics Concern   Not on file  Social History Narrative  Not on file   Social Drivers of Health   Financial Resource Strain: Not on file  Food Insecurity: Not on file  Transportation Needs: Not on file  Physical Activity: Not on file  Stress: Not on file  Social Connections: Not on file     Review of Systems   Gen: + weight loss. Denies fever, chills, anorexia. Denies fatigue, weakness. CV: Denies chest pain, palpitations, syncope, peripheral edema, and claudication. Resp: Denies dyspnea at rest, cough, wheezing, coughing up blood, and pleurisy. GI: See HPI Derm: Denies rash, itching, dry skin Psych: +anxiety. Denies depression, memory loss, confusion. No homicidal or suicidal ideation.  Heme: Denies bruising, bleeding, and enlarged lymph nodes. + pain.   Physical Exam   BP 139/81 (BP Location: Right Arm, Patient Position: Sitting, Cuff Size: Normal)   Pulse 76   Temp (!) 97.4 F (36.3 C) (Temporal)   Ht 5' 7 (1.702 m)   Wt 113 lb 9.6 oz (51.5 kg)   LMP 05/26/2016   BMI 17.79 kg/m   General:   Alert and oriented. No distress noted. Pleasant and cooperative.  Head:  Normocephalic and atraumatic. Eyes:  Conjuctiva clear without scleral icterus. Lungs:  Clear to auscultation bilaterally. No wheezes, rales, or rhonchi. No distress.  Heart:  S1, S2 present without murmurs appreciated.  Abdomen:  +BS, soft, non-distended. Very mild ttp to epigastrium. No rebound or guarding. No HSM or masses noted. Rectal: deferred Msk:  Symmetrical without gross deformities. Normal posture. Extremities:  Without edema. Neurologic:  Alert and  oriented x4 Psych:  Alert and cooperative.  Normal mood and affect.  Assessment  Brooke Woods is a 53 y.o. female with a history of colitis in 2023, chronic hepatitis C s/p treatment and documented SVR, elevated LFTs, anxiety, asthma, depression and TIA in 2012 presenting today for or follow-up to ensure she does not have colitis.   Abdominal pain, lower: History of colitis in August 2023, diagnosed with CT scan.  She had mild wall thickening and edema suggesting mild colitis.  She was given IV fluids and antibiotics.  When she was last seen in October 2023 she was not having any abdominal pain.  She was scheduled for colonoscopy at that time, subsequently canceled by patient and not rescheduled.  Suspect now that her intermittent lower abdominal pain is more so secondary to her bowel habits although she denies any overt constipation or diarrhea.  Pain usually improves after defecation.  No concerning signs of colitis at this time.  Will check CBC and CRP to assess for any inflammation.  GERD: Having some intermittent epigastric burning especially with acidic foods, has been on pantoprazole  20 mg once daily long-term.  Given some more frequency of symptoms, will increase pantoprazole  to 40 mg once daily.  Reinforced GERD diet.  History of hepatitis C, fatty liver: History of hepatitis C, treated with Epclusa.  She has documented SVR as of labs from October 2023.  Evidence of fatty liver on ultrasound in November 2022.  No recent liver enzymes within the chart.  Will obtain CMP today.   Screening for colon cancer, positive Cologuard: Has never had a colonoscopy.  Denies any family history of colon cancer or colon polyps.  Per review of media in her chart, she had a positive Cologuard in August 2023.  She is now agreeable to proceed with colonoscopy, will schedule again today.  Weight loss: Per review of weights, she has lost about 46 pounds since I last saw her in October  2023.  She reports a good appetite.  Weight loss is not necessarily  unintentional, but also not necessarily intentional either.  She reports that her weight in 2023 is much higher than her normal, she states she has been thin all of her life.  Encouraged to high-protein diet and continue to monitor her weight at home.  Will follow-up in 3 months to recheck her weight and see how she is doing with her abdominal pain.   PLAN   Proceed with colonoscopy with propofol  by Dr. Cindie  in near future: the risks, benefits, and alternatives have been discussed with the patient in detail. The patient states understanding and desires to proceed. ASA 2 CBC, CMP, CRP Pantoprazole  40 mg once daily GERD diet Monitor weight at home Follow-up in 3 months   Charmaine Melia, MSN, FNP-BC, AGACNP-BC [facility]

## 2023-03-17 ENCOUNTER — Other Ambulatory Visit: Payer: Self-pay | Admitting: *Deleted

## 2023-03-17 ENCOUNTER — Ambulatory Visit (INDEPENDENT_AMBULATORY_CARE_PROVIDER_SITE_OTHER): Payer: 59 | Admitting: Gastroenterology

## 2023-03-17 ENCOUNTER — Encounter: Payer: Self-pay | Admitting: Gastroenterology

## 2023-03-17 ENCOUNTER — Encounter: Payer: Self-pay | Admitting: *Deleted

## 2023-03-17 VITALS — BP 139/81 | HR 76 | Temp 97.4°F | Ht 67.0 in | Wt 113.6 lb

## 2023-03-17 DIAGNOSIS — Z8619 Personal history of other infectious and parasitic diseases: Secondary | ICD-10-CM | POA: Diagnosis not present

## 2023-03-17 DIAGNOSIS — R195 Other fecal abnormalities: Secondary | ICD-10-CM

## 2023-03-17 DIAGNOSIS — K219 Gastro-esophageal reflux disease without esophagitis: Secondary | ICD-10-CM | POA: Diagnosis not present

## 2023-03-17 DIAGNOSIS — Z8719 Personal history of other diseases of the digestive system: Secondary | ICD-10-CM

## 2023-03-17 DIAGNOSIS — K760 Fatty (change of) liver, not elsewhere classified: Secondary | ICD-10-CM | POA: Diagnosis not present

## 2023-03-17 DIAGNOSIS — R634 Abnormal weight loss: Secondary | ICD-10-CM | POA: Diagnosis not present

## 2023-03-17 DIAGNOSIS — Z1211 Encounter for screening for malignant neoplasm of colon: Secondary | ICD-10-CM

## 2023-03-17 DIAGNOSIS — R1030 Lower abdominal pain, unspecified: Secondary | ICD-10-CM

## 2023-03-17 MED ORDER — PEG 3350-KCL-NA BICARB-NACL 420 G PO SOLR: 4000.0000 mL | Freq: Once | ORAL | 0 refills | Status: AC

## 2023-03-17 MED ORDER — PANTOPRAZOLE SODIUM 40 MG PO TBEC: 40.0000 mg | DELAYED_RELEASE_TABLET | Freq: Every day | ORAL | 3 refills | Status: DC

## 2023-03-17 NOTE — Patient Instructions (Signed)
 We will get you scheduled for colonoscopy in the near future with Dr. Cindie.  Please continue to monitor your weight at home.  I would recommend an office visit in 3 months for us  to ensure you have not lost any further weight Rehman or weight.  Continue a high-protein diet.  For now you may take 2 of your pantoprazole  20 mg tablets until you run out.  Then I want you to start your pantoprazole  40 mg tablets, taking 1 daily 30 minutes prior to breakfast.  Follow a GERD diet:  Avoid fried, fatty, greasy, spicy, citrus foods. Avoid caffeine and carbonated beverages. Avoid chocolate. Try eating 4-6 small meals a day rather than 3 large meals. Do not eat within 3 hours of laying down. Prop head of bed up on wood or bricks to create a 6 inch incline.   It was a pleasure to see you today. I want to create trusting relationships with patients. If you receive a survey regarding your visit,  I greatly appreciate you taking time to fill this out on paper or through your MyChart. I value your feedback.  Charmaine Melia, MSN, FNP-BC, AGACNP-BC [facility]

## 2023-03-24 DIAGNOSIS — K760 Fatty (change of) liver, not elsewhere classified: Secondary | ICD-10-CM | POA: Diagnosis not present

## 2023-03-24 DIAGNOSIS — Z8719 Personal history of other diseases of the digestive system: Secondary | ICD-10-CM | POA: Diagnosis not present

## 2023-03-24 DIAGNOSIS — R634 Abnormal weight loss: Secondary | ICD-10-CM | POA: Diagnosis not present

## 2023-03-24 DIAGNOSIS — R195 Other fecal abnormalities: Secondary | ICD-10-CM | POA: Diagnosis not present

## 2023-03-25 LAB — COMPREHENSIVE METABOLIC PANEL
ALT: 10 [IU]/L (ref 0–32)
AST: 19 [IU]/L (ref 0–40)
Albumin: 4.3 g/dL (ref 3.8–4.9)
Alkaline Phosphatase: 114 [IU]/L (ref 44–121)
BUN/Creatinine Ratio: 11 (ref 9–23)
BUN: 9 mg/dL (ref 6–24)
Bilirubin Total: 0.2 mg/dL (ref 0.0–1.2)
CO2: 24 mmol/L (ref 20–29)
Calcium: 9.8 mg/dL (ref 8.7–10.2)
Chloride: 100 mmol/L (ref 96–106)
Creatinine, Ser: 0.81 mg/dL (ref 0.57–1.00)
Globulin, Total: 3.1 g/dL (ref 1.5–4.5)
Glucose: 90 mg/dL (ref 70–99)
Potassium: 3.9 mmol/L (ref 3.5–5.2)
Sodium: 139 mmol/L (ref 134–144)
Total Protein: 7.4 g/dL (ref 6.0–8.5)
eGFR: 87 mL/min/{1.73_m2} (ref >59)

## 2023-03-25 LAB — CBC
Hematocrit: 40.8 % (ref 34.0–46.6)
Hemoglobin: 13.6 g/dL (ref 11.1–15.9)
MCH: 29.4 pg (ref 26.6–33.0)
MCHC: 33.3 g/dL (ref 31.5–35.7)
MCV: 88 fL (ref 79–97)
Platelets: 258 10*3/uL (ref 150–450)
RBC: 4.63 x10E6/uL (ref 3.77–5.28)
RDW: 14.2 % (ref 11.7–15.4)
WBC: 9 10*3/uL (ref 3.4–10.8)

## 2023-03-25 LAB — C-REACTIVE PROTEIN: CRP: 4 mg/L (ref 0–10)

## 2023-04-19 ENCOUNTER — Ambulatory Visit (HOSPITAL_COMMUNITY)
Admission: RE | Admit: 2023-04-19 | Discharge: 2023-04-19 | Disposition: A | Discharge status: Home / Self Care | Payer: 59 | Source: Ambulatory Visit | Attending: Internal Medicine | Admitting: Internal Medicine

## 2023-04-19 ENCOUNTER — Ambulatory Visit (HOSPITAL_COMMUNITY): Admitting: Anesthesiology

## 2023-04-19 ENCOUNTER — Encounter (HOSPITAL_COMMUNITY): Payer: Self-pay | Admitting: Internal Medicine

## 2023-04-19 ENCOUNTER — Other Ambulatory Visit: Payer: Self-pay

## 2023-04-19 ENCOUNTER — Encounter (HOSPITAL_COMMUNITY)
Admission: RE | Disposition: A | Discharge status: Home / Self Care | Payer: Self-pay | Source: Ambulatory Visit | Attending: Internal Medicine

## 2023-04-19 DIAGNOSIS — R195 Other fecal abnormalities: Secondary | ICD-10-CM | POA: Insufficient documentation

## 2023-04-19 DIAGNOSIS — K621 Rectal polyp: Secondary | ICD-10-CM | POA: Insufficient documentation

## 2023-04-19 DIAGNOSIS — K633 Ulcer of intestine: Secondary | ICD-10-CM | POA: Diagnosis not present

## 2023-04-19 DIAGNOSIS — K635 Polyp of colon: Secondary | ICD-10-CM

## 2023-04-19 DIAGNOSIS — K5730 Diverticulosis of large intestine without perforation or abscess without bleeding: Secondary | ICD-10-CM | POA: Insufficient documentation

## 2023-04-19 DIAGNOSIS — F17210 Nicotine dependence, cigarettes, uncomplicated: Secondary | ICD-10-CM | POA: Insufficient documentation

## 2023-04-19 DIAGNOSIS — I10 Essential (primary) hypertension: Secondary | ICD-10-CM | POA: Diagnosis not present

## 2023-04-19 DIAGNOSIS — D128 Benign neoplasm of rectum: Secondary | ICD-10-CM | POA: Diagnosis not present

## 2023-04-19 DIAGNOSIS — K529 Noninfective gastroenteritis and colitis, unspecified: Secondary | ICD-10-CM | POA: Insufficient documentation

## 2023-04-19 DIAGNOSIS — Z79899 Other long term (current) drug therapy: Secondary | ICD-10-CM | POA: Insufficient documentation

## 2023-04-19 DIAGNOSIS — J45909 Unspecified asthma, uncomplicated: Secondary | ICD-10-CM | POA: Diagnosis not present

## 2023-04-19 DIAGNOSIS — D125 Benign neoplasm of sigmoid colon: Secondary | ICD-10-CM | POA: Diagnosis not present

## 2023-04-19 DIAGNOSIS — Z1211 Encounter for screening for malignant neoplasm of colon: Secondary | ICD-10-CM | POA: Diagnosis not present

## 2023-04-19 HISTORY — PX: POLYPECTOMY: SHX5525

## 2023-04-19 HISTORY — PX: COLONOSCOPY WITH PROPOFOL: SHX5780

## 2023-04-19 SURGERY — COLONOSCOPY WITH PROPOFOL
Anesthesia: General

## 2023-04-19 MED ORDER — LIDOCAINE HCL (PF) 2 % IJ SOLN
INTRAMUSCULAR | Status: AC
  Filled 2023-04-19: qty 5

## 2023-04-19 MED ORDER — LIDOCAINE HCL (CARDIAC) PF 100 MG/5ML IV SOSY
PREFILLED_SYRINGE | INTRAVENOUS | Status: DC | PRN
  Administered 2023-04-19: 60 mg via INTRATRACHEAL

## 2023-04-19 MED ORDER — PROPOFOL 1000 MG/100ML IV EMUL
INTRAVENOUS | Status: AC
  Filled 2023-04-19: qty 100

## 2023-04-19 MED ORDER — PROPOFOL 10 MG/ML IV BOLUS
INTRAVENOUS | Status: DC | PRN
  Administered 2023-04-19: 30 mg via INTRAVENOUS
  Administered 2023-04-19: 20 mg via INTRAVENOUS
  Administered 2023-04-19: 70 mg via INTRAVENOUS

## 2023-04-19 MED ORDER — PROPOFOL 500 MG/50ML IV EMUL
INTRAVENOUS | Status: DC | PRN
  Administered 2023-04-19: 125 ug/kg/min via INTRAVENOUS

## 2023-04-19 MED ORDER — LACTATED RINGERS IV SOLN
INTRAVENOUS | Status: DC | PRN
Start: 2023-04-19 — End: 2023-04-19

## 2023-04-19 NOTE — Op Note (Signed)
 [facility] Patient Name: Brooke Woods Procedure Date: 04/19/2023 8:57 AM MRN: [id number] Date of Birth: 02/07/1971 Attending MD: Hennie Duos. Marletta Lor , [place], [id number] CSN: [id number] Age: 53 Admit Type: Outpatient Procedure:                Colonoscopy Indications:              Screening for colorectal malignant neoplasm,                            Incidental - Positive Cologuard test Providers:                Hennie Duos. Marletta Lor, DO, Crystal Page, Lennice Sites                            Technician, Technician Referring MD:              Medicines:                See the Anesthesia note for documentation of the                            administered medications Complications:            No immediate complications. Estimated Blood Loss:     Estimated blood loss was minimal. Procedure:                Pre-Anesthesia Assessment:                           - The anesthesia plan was to use monitored                            anesthesia care (MAC).                           After obtaining informed consent, the colonoscope                            was passed under direct vision. Throughout the                            procedure, the patient's blood pressure, pulse, and                            oxygen saturations were monitored continuously. The                            [id number]-HQ190L ([id number]) was introduced through the                            anus and advanced to the the terminal ileum, with                            identification of the appendiceal orifice and IC                            valve. The colonoscopy was performed without  difficulty. The patient tolerated the procedure                            well. The quality of the bowel preparation was                            evaluated using the BBPS [facility] Bowel Preparation                            Scale) with scores of: Right Colon = 3, Transverse                            Colon = 3 and Left  Colon = 3 (entire mucosa seen                            well with no residual staining, small fragments of                            stool or opaque liquid). The total BBPS score                            equals 9. Scope In: 9:12:23 AM Scope Out: 9:31:53 AM Scope Withdrawal Time: 0 hours 16 minutes 42 seconds  Total Procedure Duration: 0 hours 19 minutes 30 seconds  Findings:      Multiple medium-mouthed and small-mouthed diverticula were found in the       entire colon.      A 5 mm polyp was found in the distal rectum. The polyp was sessile. The       polyp was removed with a cold snare. Resection and retrieval were       complete.      Six sessile polyps were found in the proximal rectum and sigmoid colon.       The polyps were 3 to 6 mm in size. These polyps were removed with a cold       snare. Resection and retrieval were complete.      The terminal ileum contained two 3-4 mm ulcers. No bleeding was present.       No stigmata of recent bleeding were seen. Biopsies were taken with a       cold forceps for histology. Impression:               - Diverticulosis in the entire examined colon.                           - One 5 mm polyp in the distal rectum, removed with                            a cold snare. Resected and retrieved.                           - Six 3 to 6 mm polyps in the proximal rectum and                            in the sigmoid  colon, removed with a cold snare.                            Resected and retrieved.                           - 2 ulcers in the terminal ileum. Biopsied. Moderate Sedation:      Per Anesthesia Care Recommendation:           - Patient has a contact number available for                            emergencies. The signs and symptoms of potential                            delayed complications were discussed with the                            patient. Return to normal activities tomorrow.                            Written discharge  instructions were provided to the                            patient.                           - Resume previous diet.                           - Continue present medications.                           - Await pathology results.                           - Repeat colonoscopy for surveillance based on                            pathology results.                           - Return to GI clinic in 3 months.                           - No ibuprofen, naproxen, or other non-steroidal                            anti-inflammatory drugs. Procedure Code(s):        --- Professional ---                           [phone], Colonoscopy, flexible; with removal of                            tumor(s), polyp(s), or other lesion(s) by snare  technique                           [id number], 59, Colonoscopy, flexible; with biopsy,                            single or multiple Diagnosis Code(s):        --- Professional ---                           Z12.11, Encounter for screening for malignant                            neoplasm of colon                           D12.8, Benign neoplasm of rectum                           D12.5, Benign neoplasm of sigmoid colon                           K63.3, Ulcer of intestine                           K57.30, Diverticulosis of large intestine without                            perforation or abscess without bleeding CPT copyright 2022 American Medical Association. All rights reserved. The codes documented in this report are preliminary and upon coder review may  be revised to meet current compliance requirements. Hennie Duos. Marletta Lor, DO Hennie Duos. Marletta Lor, DO 04/19/2023 9:37:12 AM This report has been signed electronically. Number of Addenda: 0

## 2023-04-19 NOTE — Discharge Instructions (Addendum)
  Colonoscopy Discharge Instructions  Read the instructions outlined below and refer to this sheet in the next few weeks. These discharge instructions provide you with general information on caring for yourself after you leave the hospital. Your doctor may also give you specific instructions. While your treatment has been planned according to the most current medical practices available, unavoidable complications occasionally occur.   ACTIVITY You may resume your regular activity, but move at a slower pace for the next 24 hours.  Take frequent rest periods for the next 24 hours.  Walking will help get rid of the air and reduce the bloated feeling in your belly (abdomen).  No driving for 24 hours (because of the medicine (anesthesia) used during the test).   Do not sign any important legal documents or operate any machinery for 24 hours (because of the anesthesia used during the test).  NUTRITION Drink plenty of fluids.  You may resume your normal diet as instructed by your doctor.  Begin with a light meal and progress to your normal diet. Heavy or fried foods are harder to digest and may make you feel sick to your stomach (nauseated).  Avoid alcoholic beverages for 24 hours or as instructed.  MEDICATIONS You may resume your normal medications unless your doctor tells you otherwise.  WHAT YOU CAN EXPECT TODAY Some feelings of bloating in the abdomen.  Passage of more gas than usual.  Spotting of blood in your stool or on the toilet paper.  IF YOU HAD POLYPS REMOVED DURING THE COLONOSCOPY: No aspirin products for 7 days or as instructed.  No alcohol for 7 days or as instructed.  Eat a soft diet for the next 24 hours.  FINDING OUT THE RESULTS OF YOUR TEST Not all test results are available during your visit. If your test results are not back during the visit, make an appointment with your caregiver to find out the results. Do not assume everything is normal if you have not heard from your  caregiver or the medical facility. It is important for you to follow up on all of your test results.  SEEK IMMEDIATE MEDICAL ATTENTION IF: You have more than a spotting of blood in your stool.  Your belly is swollen (abdominal distention).  You are nauseated or vomiting.  You have a temperature over 101.  You have abdominal pain or discomfort that is severe or gets worse throughout the day.   Your colonoscopy revealed 7 polyp(s) which I removed successfully. Await pathology results, my office will contact you.  Repeat colonoscopy timing to be determined after pathology results reviewed.  You also have diverticulosis and internal hemorrhoids. I would recommend increasing fiber in your diet or adding OTC Benefiber/Metamucil. Be sure to drink at least 4 to 6 glasses of water daily.   You also had mild inflammation in the end portion of your small bowel called the terminal ileum.  2 small ulcers in this region.  I took samples of this as well.  These are typically caused from NSAIDs though need to rule out underlying inflammatory bowel disease such as Crohn's disease.  We will call with these results as well  Follow-up with GI in 2-3 months   I hope you have a great rest of your week!  Hennie Duos. Marletta Lor, D.O. Gastroenterology and Hepatology [facility]

## 2023-04-19 NOTE — Transfer of Care (Signed)
 Immediate Anesthesia Transfer of Care Note  Patient: Brooke Woods  Procedure(s) Performed: COLONOSCOPY WITH PROPOFOL POLYPECTOMY  Patient Location: Endoscopy Unit  Anesthesia Type:General  Level of Consciousness: drowsy and patient cooperative  Airway & Oxygen Therapy: Patient Spontanous Breathing and Patient connected to nasal cannula oxygen  Post-op Assessment: Report given to RN and Post -op Vital signs reviewed and stable  Post vital signs: Reviewed and stable  Last Vitals:  Vitals Value Taken Time  BP 155/86 04/19/23 0937  Temp 36.4 C 04/19/23 0937  Pulse 72 04/19/23 0937  Resp 13 04/19/23 0937  SpO2 98 % 04/19/23 0937    Last Pain:  Vitals:   04/19/23 0937  TempSrc: Axillary  PainSc: 0-No pain      Patients Stated Pain Goal: 4 (04/19/23 1610)  Complications: No notable events documented.

## 2023-04-19 NOTE — H&P (Signed)
 Primary Care Physician:  Lawerance Sabal, [place] Primary Gastroenterologist:  Dr. Marletta Lor  Pre-Procedure History & Physical: HPI:  Brooke Woods is a 53 y.o. female is here for a colonoscopy to be performed for colon cancer screening purposes/positive COLOGUARD testing  Past Medical History:  Diagnosis Date   Anxiety    Asthmatic bronchitis    Chronic pain    Depression    Hepatitis C    genoptype 1 a, s/p treatment with Epclusa.   Stroke [facility])    Pt reported TIA 04/27/10    Past Surgical History:  Procedure Laterality Date   CHOLECYSTECTOMY  2007   FOOT SURGERY     x3 on right and 1 on left.   KNEE SURGERY     OVARY SURGERY     right oophrectomy   TUBAL LIGATION      Prior to Admission medications   Medication Sig Start Date End Date Taking? Authorizing Provider  pantoprazole (PROTONIX) 40 MG tablet Take 1 tablet (40 mg total) by mouth daily. 03/17/23  Yes Mahon, Frederik Schmidt, NP  traMADol (ULTRAM) 50 MG tablet Take 50 mg by mouth 2 (two) times daily. 11/26/21  Yes [provider]  ondansetron (ZOFRAN-ODT) 4 MG disintegrating tablet Take 4 mg by mouth every 8 (eight) hours as needed for nausea or vomiting. Patient not taking: Reported on 03/17/2023 09/30/21   [provider]    Allergies as of 03/17/2023   (No Known Allergies)    Family History  Problem Relation Age of Onset   Hypertension Mother    Hyperlipidemia Mother    Depression Mother    Cancer Father    Hypertension Father    Hyperlipidemia Father    Cirrhosis Father        alcoholic   Hyperlipidemia Sister    Hyperlipidemia Brother    Depression Brother    Hepatitis C Brother    Colon cancer Neg Hx     Social History   Socioeconomic History   Marital status: Single    Spouse name: Not on file   Number of children: Not on file   Years of education: Not on file   Highest education level: Not on file  Occupational History   Not on file  Tobacco Use   Smoking status: Every Day     Current packs/day: 1.00    Types: Cigarettes   Smokeless tobacco: Never  Vaping Use   Vaping status: Never Used  Substance and Sexual Activity   Alcohol use: No    Comment: None in 3 years   Drug use: Yes    Types: Marijuana    Comment: pain pills previously, none since 2019.   Sexual activity: Yes    Birth control/protection: Surgical  Other Topics Concern   Not on file  Social History Narrative   Not on file   Social Drivers of Health   Financial Resource Strain: Not on file  Food Insecurity: Not on file  Transportation Needs: Not on file  Physical Activity: Not on file  Stress: Not on file  Social Connections: Not on file  Intimate Partner Violence: Not on file    Review of Systems: See HPI, otherwise negative ROS  Physical Exam: Vital signs in last 24 hours: Temp:  [97.7 F (36.5 C)] 97.7 F (36.5 C) (03/11 0822) Pulse Rate:  [71] 71 (03/11 0822) Resp:  [14] 14 (03/11 0822) BP: (152)/(97) 152/97 (03/11 0822) SpO2:  [97 %] 97 % (03/11 0822) Weight:  [49.9 kg]  49.9 kg (03/11 6578)   General:   Alert,  Well-developed, well-nourished, pleasant and cooperative in NAD Head:  Normocephalic and atraumatic. Eyes:  Sclera clear, no icterus.   Conjunctiva pink. Ears:  Normal auditory acuity. Nose:  No deformity, discharge,  or lesions. Msk:  Symmetrical without gross deformities. Normal posture. Extremities:  Without clubbing or edema. Neurologic:  Alert and  oriented x4;  grossly normal neurologically. Skin:  Intact without significant lesions or rashes. Psych:  Alert and cooperative. Normal mood and affect.  Impression/Plan: Brooke Woods is here for a colonoscopy to be performed for colon cancer screening purposes/positive COLOGUARD testing  The risks of the procedure including infection, bleed, or perforation as well as benefits, limitations, alternatives and imponderables have been reviewed with the patient. Questions have been answered. All parties  agreeable.

## 2023-04-19 NOTE — Anesthesia Preprocedure Evaluation (Signed)
 Anesthesia Evaluation  Patient identified by MRN, date of birth, ID band Patient awake    Reviewed: Allergy & Precautions, H&P , NPO status , Patient's Chart, lab work & pertinent test results, reviewed documented beta blocker date and time   Airway Mallampati: II  TM Distance: >3 FB Neck ROM: full    Dental no notable dental hx.    Pulmonary asthma , Current Smoker   Pulmonary exam normal breath sounds clear to auscultation       Cardiovascular Exercise Tolerance: Good hypertension,  Rhythm:regular Rate:Normal     Neuro/Psych  PSYCHIATRIC DISORDERS Anxiety Depression    TIA   GI/Hepatic ,GERD  ,,(+) Hepatitis -  Endo/Other  negative endocrine ROS    Renal/GU negative Renal ROS  negative genitourinary   Musculoskeletal   Abdominal   Peds  Hematology negative hematology ROS (+)   Anesthesia Other Findings   Reproductive/Obstetrics negative OB ROS                              Anesthesia Physical Anesthesia Plan  ASA: 3  Anesthesia Plan: General   Post-op Pain Management:    Induction:   PONV Risk Score and Plan: Propofol infusion  Airway Management Planned:   Additional Equipment:   Intra-op Plan:   Post-operative Plan:   Informed Consent: I have reviewed the patients History and Physical, chart, labs and discussed the procedure including the risks, benefits and alternatives for the proposed anesthesia with the patient or authorized representative who has indicated his/her understanding and acceptance.     Dental Advisory Given  Plan Discussed with: CRNA  Anesthesia Plan Comments:          Anesthesia Quick Evaluation

## 2023-04-20 ENCOUNTER — Encounter (HOSPITAL_COMMUNITY): Payer: Self-pay | Admitting: Internal Medicine

## 2023-04-20 LAB — SURGICAL PATHOLOGY

## 2023-04-23 NOTE — Anesthesia Postprocedure Evaluation (Signed)
 Anesthesia Post Note  Patient: Brooke Woods  Procedure(s) Performed: COLONOSCOPY WITH PROPOFOL POLYPECTOMY  Patient location during evaluation: Phase II Anesthesia Type: General Level of consciousness: awake Pain management: pain level controlled Vital Signs Assessment: post-procedure vital signs reviewed and stable Respiratory status: spontaneous breathing and respiratory function stable Cardiovascular status: blood pressure returned to baseline and stable Postop Assessment: no headache and no apparent nausea or vomiting Anesthetic complications: no Comments: Late entry   No notable events documented.   Last Vitals:  Vitals:   04/19/23 0822 04/19/23 0937  BP: (!) 152/97 (!) 155/86  Pulse: 71 72  Resp: 14 13  Temp: 36.5 C 36.4 C  SpO2: 97% 98%    Last Pain:  Vitals:   04/19/23 0937  TempSrc: Axillary  PainSc: 0-No pain                 Windell Norfolk

## 2023-05-23 ENCOUNTER — Encounter: Payer: Self-pay | Admitting: Gastroenterology

## 2023-05-23 ENCOUNTER — Ambulatory Visit: Admitting: Gastroenterology

## 2023-05-23 VITALS — BP 153/94 | HR 58 | Temp 98.2°F | Ht 67.0 in | Wt 114.4 lb

## 2023-05-23 DIAGNOSIS — R1013 Epigastric pain: Secondary | ICD-10-CM

## 2023-05-23 DIAGNOSIS — K760 Fatty (change of) liver, not elsewhere classified: Secondary | ICD-10-CM

## 2023-05-23 DIAGNOSIS — Z8719 Personal history of other diseases of the digestive system: Secondary | ICD-10-CM

## 2023-05-23 DIAGNOSIS — K529 Noninfective gastroenteritis and colitis, unspecified: Secondary | ICD-10-CM | POA: Diagnosis not present

## 2023-05-23 DIAGNOSIS — K219 Gastro-esophageal reflux disease without esophagitis: Secondary | ICD-10-CM | POA: Diagnosis not present

## 2023-05-23 DIAGNOSIS — R634 Abnormal weight loss: Secondary | ICD-10-CM | POA: Diagnosis not present

## 2023-05-23 DIAGNOSIS — B182 Chronic viral hepatitis C: Secondary | ICD-10-CM

## 2023-05-23 DIAGNOSIS — Z8619 Personal history of other infectious and parasitic diseases: Secondary | ICD-10-CM | POA: Diagnosis not present

## 2023-05-23 NOTE — Progress Notes (Signed)
 GI Office Note    Referring Provider: Lawerance Sabal, [place] Primary Care Physician:  Lawerance Sabal, [place] Primary Gastroenterologist: Hennie Duos. Marletta Lor, DO  Date:  05/23/2023  ID:  Brooke Woods, DOB [birth year], MRN [id number]   Chief Complaint   Chief Complaint  Patient presents with   Follow-up    Still having issues with abd pain   History of Present Illness  Brooke Woods is a 53 y.o. female with a history of anxiety, depression, hepatitis C s/p treatment with Epclusa with documented eradication, TIA in 2012, chronic pain, and bronchitis presenting today for follow-up of abdominal pain still with some ongoing mild symptoms.  OV in April 2023.  Reportedly was doing well.  Happy that she completed her hepatitis C treatment with Epclusa.  She denies any abdominal pain, constipation, diarrhea, BRBPR, melena, weight loss, nausea or vomiting.  She did report struggling with heartburn that started returning age 54 and reported it was becoming more frequent, occurring at least 3 days a week.  He was not eating spicy or a lot of fried/greasy foods.  Did admit to drinking Dr. Reino Kent daily.  Declined colonoscopy, reports she had Cologuard at home. Advised repeat HCVRNA in July 2023, advised to complete hepatitis a and B vaccinations.  She was started on Protonix 20 mg daily 30 minutes prior to breakfast.  Advised to complete Cologuard.  Diet and exercise reinforced.   ED visit for nausea vomiting at [facility] on 09/30/2021.  She had some leukocytosis, potassium 2.9, lipase 495.  CT A/P with collapsed colon, appeared to show mild wall thickening/edema suggesting mild colitis, previous evidence of cholecystectomy.  She was treated with antiemetics, IV fluids, Cipro and Flagyl for 10 days.  She was recommended to have colonoscopy in 6 weeks follow-up with PCP in 1-2 weeks.   Last office visit 11/12/21.  Follow with pain clinic as she was getting Suboxone.  Given her chronic pain she has been to the ED and  was to have a new pain doctor soon.  Reports she took all of her antibiotics from her ED visit and has some postprandial urgency for a few weeks.  Not currently having any abdominal pain, was previously in the lower abdomen.  Nausea and vomiting resolved.  Reported 20 pound weight loss since stopped taking methadone.  She denied issues with colitis in the past.  Never had a colonoscopy. Advised to continue pantoprazole 20 mg once daily.  HCVRNA.  Colonoscopy ordered.   HCVRNA not detected in October 2023.   Patient eventually canceled procedure and did not call to reschedule.  Last office visit 03/17/23.  Reported mild intermittent stomach pains, sometimes daily and then may happen once a week.  Oranges can cause burning for 2 days and at times will have sharp gas pains.  Denied odynophagia or dysphagia.  Denies any rectal bleeding.  Bowel movements every other day and usually soft formed stool and not having to strain.  Appetite at baseline.  She reported an abnormal Cologuard.  She reported her weight gain in October 2023 was an anomaly for her.  She reports being thin all her life.  Advise continue to monitor weight at home.  Start pantoprazole 40 mg once daily.  Labs ordered as well as colonoscopy.  Colonoscopy 04/19/2023: - Diverticulosis in the entire examined colon.  - One 5 mm polyp in the distal rectum, removed with a cold snare - Six 3 to 6 mm polyps in the proximal rectum and in the sigmoid  colon - 2 ulcers in the terminal ileum. Biopsied. - Inflammation noted in the TI.  This was possibly medication induced versus resolving infection (NSAIDs).  Polyps revealed to be tubular, and hyperplastic. -Repeat colonoscopy in 7 years     Latest Ref Rng & Units 03/24/2023   12:25 PM 06/01/2021    2:32 PM 05/06/2021    2:04 PM  CMP  Glucose 70 - 99 mg/dL 90     BUN 6 - 24 mg/dL 9     Creatinine 1.30 - 1.00 mg/dL 8.65     Sodium 784 - 696 mmol/L 139     Potassium 3.5 - 5.2 mmol/L 3.9     Chloride  96 - 106 mmol/L 100     CO2 20 - 29 mmol/L 24     Calcium 8.7 - 10.2 mg/dL 9.8     Total Protein 6.0 - 8.5 g/dL 7.4  7.6  7.4   Total Bilirubin 0.0 - 1.2 mg/dL 0.2  0.2  <2.9   Alkaline Phos 44 - 121 IU/L 114  124  108   AST 0 - 40 IU/L 19  19  22    ALT 0 - 32 IU/L 10  14  13      Today:  Only having occasional pain in the left upper side and thought maybe she forgot her reflux medication. Once every couple weeks this comes. Has excellent appetite as well. Potentially wings or sauce that bothered her. This in particular bothers her.  Denies nausea, vomiting, dysphagia, early satiety.  No issues with BM, no hard or loose stools. No melena or brpbr.   No longer taking suboxone.    Wt Readings from Last 10 Encounters:  05/23/23 114 lb 6.4 oz (51.9 kg)  04/19/23 110 lb (49.9 kg)  03/17/23 113 lb 9.6 oz (51.5 kg)  11/12/21 159 lb 6.4 oz (72.3 kg)  06/03/21 173 lb 3.2 oz (78.6 kg)  12/26/20 170 lb 3.2 oz (77.2 kg)  06/28/17 107 lb (48.5 kg)  11/02/16 105 lb 12.8 oz (48 kg)  06/19/16 110 lb (49.9 kg)  04/23/15 111 lb (50.3 kg)    Current Outpatient Medications  Medication Sig Dispense Refill   pantoprazole (PROTONIX) 40 MG tablet Take 1 tablet (40 mg total) by mouth daily. 90 tablet 3   No current facility-administered medications for this visit.    Past Medical History:  Diagnosis Date   Anxiety    Asthmatic bronchitis    Chronic pain    Depression    Hepatitis C    genoptype 1 a, s/p treatment with Epclusa.   Stroke [facility])    Pt reported TIA 04/27/10    Past Surgical History:  Procedure Laterality Date   CHOLECYSTECTOMY  2007   COLONOSCOPY WITH PROPOFOL N/A 04/19/2023   Procedure: COLONOSCOPY WITH PROPOFOL;  Surgeon: Lanelle Bal, DO;  [facility];  Service: Endoscopy;  Laterality: N/A;  9:30 am, asa 2   FOOT SURGERY     x3 on right and 1 on left.   KNEE SURGERY     OVARY SURGERY     right oophrectomy   POLYPECTOMY  04/19/2023   Procedure:  POLYPECTOMY;  Surgeon: Lanelle Bal, DO;  [facility];  Service: Endoscopy;;   TUBAL LIGATION      Family History  Problem Relation Age of Onset   Hypertension Mother    Hyperlipidemia Mother    Depression Mother    Cancer Father    Hypertension Father  Hyperlipidemia Father    Cirrhosis Father        alcoholic   Hyperlipidemia Sister    Hyperlipidemia Brother    Depression Brother    Hepatitis C Brother    Colon cancer Neg Hx     Allergies as of 05/23/2023   (No Known Allergies)    Social History   Socioeconomic History   Marital status: Single    Spouse name: Not on file   Number of children: Not on file   Years of education: Not on file   Highest education level: Not on file  Occupational History   Not on file  Tobacco Use   Smoking status: Every Day    Current packs/day: 1.00    Types: Cigarettes   Smokeless tobacco: Never  Vaping Use   Vaping status: Never Used  Substance and Sexual Activity   Alcohol use: No    Comment: None in 3 years   Drug use: Yes    Types: Marijuana    Comment: pain pills previously, none since 2019.   Sexual activity: Yes    Birth control/protection: Surgical  Other Topics Concern   Not on file  Social History Narrative   Not on file   Social Drivers of Health   Financial Resource Strain: Not on file  Food Insecurity: Not on file  Transportation Needs: Not on file  Physical Activity: Not on file  Stress: Not on file  Social Connections: Not on file   Review of Systems   Gen: Denies fever, chills, anorexia. Denies fatigue, weakness, weight loss.  CV: Denies chest pain, palpitations, syncope, peripheral edema, and claudication. Resp: Denies dyspnea at rest, cough, wheezing, coughing up blood, and pleurisy. GI: See HPI Derm: Denies rash, itching, dry skin Psych: Denies depression, anxiety, memory loss, confusion. No homicidal or suicidal ideation.  Heme: Denies bruising, bleeding, and enlarged lymph  nodes.  Physical Exam   BP (!) 153/94 (BP Location: Right Arm, Patient Position: Sitting, Cuff Size: Normal)   Pulse (!) 58   Temp 98.2 F (36.8 C) (Oral)   Ht 5\' 7"  (1.702 m)   Wt 114 lb 6.4 oz (51.9 kg)   LMP 05/26/2016   SpO2 98%   BMI 17.92 kg/m   General:   Alert and oriented. No distress noted. Pleasant and cooperative.  Head:  Normocephalic and atraumatic. Eyes:  Conjuctiva clear without scleral icterus. Mouth:  Oral mucosa pink and moist. Good dentition. No lesions. Abdomen:  +BS, soft, non-tender and non-distended. No rebound or guarding. No HSM or masses noted. Rectal: deferred Msk:  Symmetrical without gross deformities. Normal posture. Extremities:  Without edema. Neurologic:  Alert and  oriented x4 Psych:  Alert and cooperative. Normal mood and affect.  Assessment  Brooke Woods is a 53 y.o. female with a history of colitis in 2023, chronic Hep C treated with Epclusa with documented eradication, anxiety, asthma, depression, and TIA in 2012 presenting today for follow up post colonoscopy.   Abdominal pain, history of colitis, recent ileitis:  - History of colitis in August 2023 on CT scan, treated with IV fluids and antibiotics - History of colitis likely NSAID induced or possible resolving infection - Recent ileitis noted on colonoscopy pathology report from biopsies obtained from distal ileal ulcers. - Most recent abdominal pain mostly in the epigastric region related to reflux.  GERD:  - Has intermittent epigastric burning with acidic foods such as orange juice.  Also recently has been having some issues after eating barbecue  sauce with wings but no other significant triggers.   -Overall symptoms well-controlled with pantoprazole 40 mg once daily -Has occasionally taken extra dose if she eats a trigger food. -GERD diet reinforced today -Encouraged use of H2 blocker prior to consuming a trigger food or Tums as needed for breakthrough -If symptoms were to  worsen in the future, could consider EGD.  History of hepatitis C, fatty liver:  - History of hep C treated with Epclusa with documented eradication in October 2023 - Hepatic steatosis noted on imaging in November 2022 - Most recent LFTs within normal limits, has history of elevation. - Will continue with at least annual surveillance of LFTs  Weight loss: -Weight maintained over the last couple months. - Possible increase in weight with large drop related to being on Suboxone and subsequent weaning from medication - Has excellent appetite and no longer having any weight loss, this is more her baseline based off prior history of weights.  PLAN   Pantoprazole 40 mg once daily Famotidine 20 mg as needed for breakthrough.  Tums as needed GERD diet Annual LFTs Recall colonoscopy in 2032 Follow up in 1 year, sooner if needed.  Julian Obey, MSN, FNP-BC, AGACNP-BC [facility]

## 2023-05-23 NOTE — Patient Instructions (Addendum)
 Continue pantoprazole 40 mg once daily.   If you are going to eat something that typically bothers you but she has been avoiding, it is okay to do this in moderation and take some famotidine 20 mg about 30 minutes prior to the meal.  If you continue to have some symptoms after consumption, you can use Tums as needed.  If symptoms are severe you could take an extra dose of pantoprazole.  If it becomes a severe then I would avoid that food in the future.  Follow a GERD diet:  Avoid fried, fatty, greasy, spicy, citrus foods. Avoid caffeine and carbonated beverages. Avoid chocolate. Try eating 4-6 small meals a day rather than 3 large meals. Do not eat within 3 hours of laying down. Prop head of bed up on wood or bricks to create a 6 inch incline.  Given your symptoms are stable and you are feeling better, we will follow-up in 1 year or sooner if needed.  It was a pleasure to see you today. I want to create trusting relationships with patients. If you receive a survey regarding your visit,  I greatly appreciate you taking time to fill this out on paper or through your MyChart. I value your feedback.  Julian Obey, MSN, FNP-BC, AGACNP-BC [facility]

## 2023-07-05 DIAGNOSIS — R079 Chest pain, unspecified: Secondary | ICD-10-CM | POA: Diagnosis not present

## 2023-07-05 DIAGNOSIS — M7712 Lateral epicondylitis, left elbow: Secondary | ICD-10-CM | POA: Diagnosis not present

## 2023-07-05 DIAGNOSIS — Z681 Body mass index (BMI) 19 or less, adult: Secondary | ICD-10-CM | POA: Diagnosis not present

## 2023-07-05 DIAGNOSIS — G8929 Other chronic pain: Secondary | ICD-10-CM | POA: Diagnosis not present

## 2023-07-26 ENCOUNTER — Inpatient Hospital Stay (HOSPITAL_COMMUNITY)

## 2023-07-26 ENCOUNTER — Other Ambulatory Visit: Payer: Self-pay

## 2023-07-26 ENCOUNTER — Inpatient Hospital Stay (HOSPITAL_COMMUNITY)
Admission: EM | Admit: 2023-07-26 | Discharge: 2023-07-28 | DRG: 386 | Disposition: A | Discharge status: Home / Self Care | Attending: Family Medicine | Admitting: Family Medicine

## 2023-07-26 ENCOUNTER — Emergency Department (HOSPITAL_COMMUNITY)

## 2023-07-26 ENCOUNTER — Encounter (HOSPITAL_COMMUNITY): Payer: Self-pay | Admitting: Internal Medicine

## 2023-07-26 DIAGNOSIS — K521 Toxic gastroenteritis and colitis: Secondary | ICD-10-CM | POA: Diagnosis present

## 2023-07-26 DIAGNOSIS — F17210 Nicotine dependence, cigarettes, uncomplicated: Secondary | ICD-10-CM | POA: Diagnosis present

## 2023-07-26 DIAGNOSIS — K2100 Gastro-esophageal reflux disease with esophagitis, without bleeding: Secondary | ICD-10-CM | POA: Diagnosis not present

## 2023-07-26 DIAGNOSIS — K219 Gastro-esophageal reflux disease without esophagitis: Secondary | ICD-10-CM | POA: Diagnosis present

## 2023-07-26 DIAGNOSIS — K838 Other specified diseases of biliary tract: Secondary | ICD-10-CM | POA: Diagnosis present

## 2023-07-26 DIAGNOSIS — J45909 Unspecified asthma, uncomplicated: Secondary | ICD-10-CM | POA: Diagnosis present

## 2023-07-26 DIAGNOSIS — Z8673 Personal history of transient ischemic attack (TIA), and cerebral infarction without residual deficits: Secondary | ICD-10-CM | POA: Diagnosis not present

## 2023-07-26 DIAGNOSIS — Z860101 Personal history of adenomatous and serrated colon polyps: Secondary | ICD-10-CM

## 2023-07-26 DIAGNOSIS — Z79899 Other long term (current) drug therapy: Secondary | ICD-10-CM | POA: Diagnosis not present

## 2023-07-26 DIAGNOSIS — E876 Hypokalemia: Secondary | ICD-10-CM | POA: Diagnosis present

## 2023-07-26 DIAGNOSIS — R109 Unspecified abdominal pain: Secondary | ICD-10-CM | POA: Diagnosis not present

## 2023-07-26 DIAGNOSIS — I700 Atherosclerosis of aorta: Secondary | ICD-10-CM | POA: Diagnosis not present

## 2023-07-26 DIAGNOSIS — K529 Noninfective gastroenteritis and colitis, unspecified: Secondary | ICD-10-CM

## 2023-07-26 DIAGNOSIS — K50912 Crohn's disease, unspecified, with intestinal obstruction: Secondary | ICD-10-CM | POA: Diagnosis present

## 2023-07-26 DIAGNOSIS — K565 Intestinal adhesions [bands], unspecified as to partial versus complete obstruction: Principal | ICD-10-CM

## 2023-07-26 DIAGNOSIS — K6389 Other specified diseases of intestine: Secondary | ICD-10-CM | POA: Diagnosis not present

## 2023-07-26 DIAGNOSIS — F411 Generalized anxiety disorder: Secondary | ICD-10-CM | POA: Diagnosis present

## 2023-07-26 DIAGNOSIS — G894 Chronic pain syndrome: Secondary | ICD-10-CM | POA: Diagnosis present

## 2023-07-26 DIAGNOSIS — B182 Chronic viral hepatitis C: Secondary | ICD-10-CM | POA: Diagnosis present

## 2023-07-26 DIAGNOSIS — R112 Nausea with vomiting, unspecified: Secondary | ICD-10-CM | POA: Diagnosis not present

## 2023-07-26 DIAGNOSIS — T39395A Adverse effect of other nonsteroidal anti-inflammatory drugs [NSAID], initial encounter: Secondary | ICD-10-CM | POA: Diagnosis present

## 2023-07-26 DIAGNOSIS — Z83438 Family history of other disorder of lipoprotein metabolism and other lipidemia: Secondary | ICD-10-CM | POA: Diagnosis not present

## 2023-07-26 DIAGNOSIS — Z8249 Family history of ischemic heart disease and other diseases of the circulatory system: Secondary | ICD-10-CM | POA: Diagnosis not present

## 2023-07-26 DIAGNOSIS — K760 Fatty (change of) liver, not elsewhere classified: Secondary | ICD-10-CM | POA: Diagnosis present

## 2023-07-26 DIAGNOSIS — K5651 Intestinal adhesions [bands], with partial obstruction: Principal | ICD-10-CM | POA: Diagnosis present

## 2023-07-26 DIAGNOSIS — K56609 Unspecified intestinal obstruction, unspecified as to partial versus complete obstruction: Secondary | ICD-10-CM | POA: Diagnosis present

## 2023-07-26 LAB — LIPASE, BLOOD: Lipase: 28 U/L (ref 11–51)

## 2023-07-26 LAB — CBC WITH DIFFERENTIAL/PLATELET
Abs Immature Granulocytes: 0.03 10*3/uL (ref 0.00–0.07)
Basophils Absolute: 0.1 10*3/uL (ref 0.0–0.1)
Basophils Relative: 1 %
Eosinophils Absolute: 0 10*3/uL (ref 0.0–0.5)
Eosinophils Relative: 0 %
HCT: 41.6 % (ref 36.0–46.0)
Hemoglobin: 14.8 g/dL (ref 12.0–15.0)
Immature Granulocytes: 0 %
Lymphocytes Relative: 18 %
Lymphs Abs: 1.9 10*3/uL (ref 0.7–4.0)
MCH: 30.5 pg (ref 26.0–34.0)
MCHC: 35.6 g/dL (ref 30.0–36.0)
MCV: 85.6 fL (ref 80.0–100.0)
Monocytes Absolute: 0.5 10*3/uL (ref 0.1–1.0)
Monocytes Relative: 5 %
Neutro Abs: 7.9 10*3/uL — ABNORMAL HIGH (ref 1.7–7.7)
Neutrophils Relative %: 76 %
Platelets: 221 10*3/uL (ref 150–400)
RBC: 4.86 MIL/uL (ref 3.87–5.11)
RDW: 13.2 % (ref 11.5–15.5)
WBC: 10.4 10*3/uL (ref 4.0–10.5)
nRBC: 0 % (ref 0.0–0.2)

## 2023-07-26 LAB — URINALYSIS, ROUTINE W REFLEX MICROSCOPIC
Bilirubin Urine: NEGATIVE
Glucose, UA: NEGATIVE mg/dL
Hgb urine dipstick: NEGATIVE
Ketones, ur: 40 mg/dL — AB
Leukocytes,Ua: NEGATIVE
Nitrite: NEGATIVE
Specific Gravity, Urine: <1.005 — ABNORMAL LOW (ref 1.005–1.030)
pH: 6 (ref 5.0–8.0)

## 2023-07-26 LAB — COMPREHENSIVE METABOLIC PANEL WITH GFR
ALT: 13 U/L (ref 0–44)
AST: 17 U/L (ref 15–41)
Albumin: 4 g/dL (ref 3.5–5.0)
Alkaline Phosphatase: 86 U/L (ref 38–126)
Anion gap: 14 (ref 5–15)
BUN: 16 mg/dL (ref 6–20)
CO2: 22 mmol/L (ref 22–32)
Calcium: 9.6 mg/dL (ref 8.9–10.3)
Chloride: 102 mmol/L (ref 98–111)
Creatinine, Ser: 0.69 mg/dL (ref 0.44–1.00)
GFR, Estimated: >60 mL/min (ref >60)
Glucose, Bld: 108 mg/dL — ABNORMAL HIGH (ref 70–99)
Potassium: 3.4 mmol/L — ABNORMAL LOW (ref 3.5–5.1)
Sodium: 138 mmol/L (ref 135–145)
Total Bilirubin: 1 mg/dL (ref 0.0–1.2)
Total Protein: 7.8 g/dL (ref 6.5–8.1)

## 2023-07-26 LAB — HIV ANTIBODY (ROUTINE TESTING W REFLEX): HIV Screen 4th Generation wRfx: NONREACTIVE

## 2023-07-26 LAB — URINALYSIS, MICROSCOPIC (REFLEX)
Bacteria, UA: NONE SEEN
RBC / HPF: NONE SEEN RBC/hpf (ref 0–5)
Squamous Epithelial / HPF: NONE SEEN /HPF (ref 0–5)

## 2023-07-26 LAB — C-REACTIVE PROTEIN: CRP: 0.6 mg/dL (ref <1.0)

## 2023-07-26 MED ORDER — NICOTINE 7 MG/24HR TD PT24
7.0000 mg | MEDICATED_PATCH | Freq: Every day | TRANSDERMAL | Status: DC
  Administered 2023-07-26 – 2023-07-27 (×2): 7 mg via TRANSDERMAL
  Filled 2023-07-26 (×4): qty 1

## 2023-07-26 MED ORDER — GADOBUTROL 1 MMOL/ML IV SOLN
5.5000 mL | Freq: Once | INTRAVENOUS | Status: AC | PRN
Start: 2023-07-26 — End: 2023-07-26
  Administered 2023-07-26: 5.5 mL via INTRAVENOUS

## 2023-07-26 MED ORDER — IOHEXOL 300 MG/ML  SOLN
100.0000 mL | Freq: Once | INTRAMUSCULAR | Status: AC | PRN
  Administered 2023-07-26: 100 mL via INTRAVENOUS

## 2023-07-26 MED ORDER — HYDROMORPHONE HCL 1 MG/ML IJ SOLN
1.0000 mg | Freq: Once | INTRAMUSCULAR | Status: AC
  Administered 2023-07-26: 1 mg via INTRAVENOUS
  Filled 2023-07-26: qty 1

## 2023-07-26 MED ORDER — ONDANSETRON HCL 4 MG/2ML IJ SOLN
4.0000 mg | Freq: Once | INTRAMUSCULAR | Status: AC
  Administered 2023-07-26: 4 mg via INTRAVENOUS
  Filled 2023-07-26: qty 2

## 2023-07-26 MED ORDER — POTASSIUM CHLORIDE 10 MEQ/100ML IV SOLN
10.0000 meq | INTRAVENOUS | Status: AC
  Administered 2023-07-26: 10 meq via INTRAVENOUS
  Filled 2023-07-26: qty 100

## 2023-07-26 MED ORDER — PANTOPRAZOLE SODIUM 40 MG IV SOLR
40.0000 mg | INTRAVENOUS | Status: DC
  Administered 2023-07-26 – 2023-07-28 (×3): 40 mg via INTRAVENOUS
  Filled 2023-07-26 (×3): qty 10

## 2023-07-26 MED ORDER — ACETAMINOPHEN 325 MG PO TABS: 650.0000 mg | ORAL_TABLET | Freq: Four times a day (QID) | ORAL | Status: DC | PRN

## 2023-07-26 MED ORDER — HYDROMORPHONE HCL 1 MG/ML IJ SOLN
0.5000 mg | INTRAMUSCULAR | Status: DC | PRN
  Administered 2023-07-26 – 2023-07-28 (×10): 0.5 mg via INTRAVENOUS
  Filled 2023-07-26 (×10): qty 0.5

## 2023-07-26 MED ORDER — LACTATED RINGERS IV SOLN: INTRAVENOUS | Status: DC

## 2023-07-26 MED ORDER — LORAZEPAM 2 MG/ML IJ SOLN
1.0000 mg | Freq: Once | INTRAMUSCULAR | Status: AC
  Administered 2023-07-26: 1 mg via INTRAVENOUS
  Filled 2023-07-26: qty 1

## 2023-07-26 MED ORDER — ONDANSETRON HCL 4 MG/2ML IJ SOLN
4.0000 mg | Freq: Four times a day (QID) | INTRAMUSCULAR | Status: DC | PRN
  Administered 2023-07-26: 4 mg via INTRAVENOUS
  Filled 2023-07-26: qty 2

## 2023-07-26 MED ORDER — SODIUM CHLORIDE 0.9 % IV BOLUS
1000.0000 mL | Freq: Once | INTRAVENOUS | Status: AC
  Administered 2023-07-26: 1000 mL via INTRAVENOUS

## 2023-07-26 MED ORDER — ACETAMINOPHEN 650 MG RE SUPP: 650.0000 mg | Freq: Four times a day (QID) | RECTAL | Status: DC | PRN

## 2023-07-26 MED ORDER — METHYLPREDNISOLONE SODIUM SUCC 125 MG IJ SOLR
60.0000 mg | Freq: Every day | INTRAMUSCULAR | Status: DC
  Administered 2023-07-26 – 2023-07-28 (×3): 60 mg via INTRAVENOUS
  Filled 2023-07-26 (×3): qty 2

## 2023-07-26 MED ORDER — ENOXAPARIN SODIUM 40 MG/0.4ML IJ SOSY
40.0000 mg | PREFILLED_SYRINGE | INTRAMUSCULAR | Status: DC
  Administered 2023-07-26 – 2023-07-28 (×3): 40 mg via SUBCUTANEOUS
  Filled 2023-07-26 (×3): qty 0.4

## 2023-07-26 MED ORDER — HYOSCYAMINE SULFATE 0.125 MG PO TBDP
0.2500 mg | ORAL_TABLET | Freq: Four times a day (QID) | ORAL | Status: DC | PRN
  Administered 2023-07-26: 0.25 mg via ORAL
  Filled 2023-07-26 (×3): qty 2

## 2023-07-26 NOTE — Plan of Care (Signed)

## 2023-07-26 NOTE — ED Notes (Signed)
 Pt gone to MRI

## 2023-07-26 NOTE — ED Notes (Signed)
 ED TO INPATIENT HANDOFF REPORT  ED Nurse Name and Phone #: [phone]  S Name/Age/Gender Brooke [name] 53 y.o. female Room/Bed: APA18/APA18  Code Status   Code Status: Full Code  Home/SNF/Other Home Patient oriented to: self, place, time, and situation Is this baseline? Yes   Triage Complete: Triage complete  Chief Complaint Small bowel obstruction (HCC) [K56.609]  Triage Note Pt c/o abdominal pain and N/V.    Allergies No Known Allergies  Level of Care/Admitting Diagnosis ED Disposition     ED Disposition  Admit   Condition  --   Comment  Hospital [facility] [[id number]]  Level of Care: Med-Surg [16]  Covid Evaluation: Asymptomatic - no recent exposure (last 10 days) testing not required  Diagnosis: Small bowel obstruction [facility]) [[id number]]  Admitting Physician: ADEFESO, OLADAPO [[id number]]  Attending Physician: ADEFESO, OLADAPO [[id number]]  Certification:: I certify this patient will need inpatient services for at least 2 midnights  Expected Medical Readiness: 07/29/2023          B Medical/Surgery History Past Medical History:  Diagnosis Date   Anxiety    Asthmatic bronchitis    Chronic pain    Depression    Hepatitis C    genoptype 1 a, s/p treatment with Epclusa.   Stroke [facility])    Pt reported TIA 04/27/10   Past Surgical History:  Procedure Laterality Date   CHOLECYSTECTOMY  2007   COLONOSCOPY WITH PROPOFOL  N/A 04/19/2023   Procedure: COLONOSCOPY WITH PROPOFOL ;  Surgeon: Vinetta Greening, DO;  [facility];  Service: Endoscopy;  Laterality: N/A;  9:30 am, asa 2   FOOT SURGERY     x3 on right and 1 on left.   KNEE SURGERY     OVARY SURGERY     right oophrectomy   POLYPECTOMY  04/19/2023   Procedure: POLYPECTOMY;  Surgeon: Vinetta Greening, DO;  [facility];  Service: Endoscopy;;   TUBAL LIGATION       A IV Location/Drains/Wounds Patient Lines/Drains/Airways Status     Active Line/Drains/Airways     Name  Placement date Placement time Site Days   Peripheral IV 07/26/23 20 G Posterior;Right Forearm 07/26/23  0416  Forearm  less than 1            Intake/Output Last 24 hours  Intake/Output Summary (Last 24 hours) at 07/26/2023 1044 Last data filed at 07/26/2023 0917 Gross per 24 hour  Intake 1086.11 ml  Output --  Net 1086.11 ml    Labs/Imaging Results for orders placed or performed during the hospital encounter of 07/26/23 (from the past 48 hours)  CBC with Differential/Platelet     Status: Abnormal   Collection Time: 07/26/23  4:15 AM  Result Value Ref Range   WBC 10.4 4.0 - 10.5 K/uL   RBC 4.86 3.87 - 5.11 MIL/uL   Hemoglobin 14.8 12.0 - 15.0 g/dL   HCT 46.9 62.9 - 52.8 %   MCV 85.6 80.0 - 100.0 fL   MCH 30.5 26.0 - 34.0 pg   MCHC 35.6 30.0 - 36.0 g/dL   RDW 41.3 24.4 - 01.0 %   Platelets 221 150 - 400 K/uL   nRBC 0.0 0.0 - 0.2 %   Neutrophils Relative % 76 %   Neutro Abs 7.9 (H) 1.7 - 7.7 K/uL   Lymphocytes Relative 18 %   Lymphs Abs 1.9 0.7 - 4.0 K/uL   Monocytes Relative 5 %   Monocytes Absolute 0.5 0.1 - 1.0 K/uL  Eosinophils Relative 0 %   Eosinophils Absolute 0.0 0.0 - 0.5 K/uL   Basophils Relative 1 %   Basophils Absolute 0.1 0.0 - 0.1 K/uL   Immature Granulocytes 0 %   Abs Immature Granulocytes 0.03 0.00 - 0.07 K/uL    Comment: Performed at [facility], [address], [place], [place] [address]  Comprehensive metabolic panel with GFR     Status: Abnormal   Collection Time: 07/26/23  4:15 AM  Result Value Ref Range   Sodium 138 135 - 145 mmol/L   Potassium 3.4 (L) 3.5 - 5.1 mmol/L   Chloride 102 98 - 111 mmol/L   CO2 22 22 - 32 mmol/L   Glucose, Bld 108 (H) 70 - 99 mg/dL    Comment: Glucose reference range applies only to samples taken after fasting for at least 8 hours.   BUN 16 6 - 20 mg/dL   Creatinine, Ser 0.10 0.44 - 1.00 mg/dL   Calcium 9.6 8.9 - 27.2 mg/dL   Total Protein 7.8 6.5 - 8.1 g/dL   Albumin 4.0 3.5 - 5.0 g/dL   AST 17 15 - 41 U/L    ALT 13 0 - 44 U/L   Alkaline Phosphatase 86 38 - 126 U/L   Total Bilirubin 1.0 0.0 - 1.2 mg/dL   GFR, Estimated >53 >66 mL/min    Comment: (NOTE) Calculated using the CKD-EPI Creatinine Equation (2021)    Anion gap 14 5 - 15    Comment: Performed at [facility], [address] [place], [place] [address]  Lipase, blood     Status: None   Collection Time: 07/26/23  4:15 AM  Result Value Ref Range   Lipase 28 11 - 51 U/L    Comment: Performed at [facility], [address], [place], [place] [address]  Urinalysis, Routine w reflex microscopic -Urine, Clean Catch     Status: Abnormal   Collection Time: 07/26/23  6:46 AM  Result Value Ref Range   Color, Urine YELLOW YELLOW   APPearance CLEAR CLEAR   Specific Gravity, Urine <1.005 (L) 1.005 - 1.030   pH 6.0 5.0 - 8.0   Glucose, UA NEGATIVE NEGATIVE mg/dL   Hgb urine dipstick NEGATIVE NEGATIVE   Bilirubin Urine NEGATIVE NEGATIVE   Ketones, ur 40 (A) NEGATIVE mg/dL   Protein, ur TRACE (A) NEGATIVE mg/dL   Nitrite NEGATIVE NEGATIVE   Leukocytes,Ua NEGATIVE NEGATIVE    Comment: Performed at [facility], [address], [place], [place] [address]  Urinalysis, Microscopic (reflex)     Status: None   Collection Time: 07/26/23  6:46 AM  Result Value Ref Range   RBC / HPF NONE SEEN 0 - 5 RBC/hpf   WBC, UA 0-5 0 - 5 WBC/hpf   Bacteria, UA NONE SEEN NONE SEEN   Squamous Epithelial / HPF NONE SEEN 0 - 5 /HPF    Comment: Performed at [facility], [address], [place], [place] [address]   CT ABDOMEN PELVIS W CONTRAST Result Date: 07/26/2023 CLINICAL DATA:  Nonlocalized abdominal pain with nausea vomiting. EXAM: CT ABDOMEN AND PELVIS WITH CONTRAST TECHNIQUE: Multidetector CT imaging of the abdomen and pelvis was performed using the standard protocol following bolus administration of intravenous contrast. RADIATION DOSE REDUCTION: This exam was performed according to the departmental dose-optimization program which includes automated exposure  control, adjustment of the mA and/or kV according to patient size and/or use of iterative reconstruction technique. CONTRAST:  OMNIPAQUE IOHEXOL 300 MG/ML  SOLN COMPARISON:  07/22/2014  FINDINGS: Lower chest: No acute findings. Hepatobiliary: No suspicious focal abnormality within the liver parenchyma. Cholecystectomy. Common bile duct is dilated up to about 11 mm diameter in the head of the pancreas with mild intrahepatic biliary duct prominence. Pancreas: No focal mass lesion. No dilatation of the main duct. No intraparenchymal cyst. No peripancreatic edema. Spleen: No splenomegaly. No suspicious focal mass lesion. Adrenals/Urinary Tract: No adrenal nodule or mass. Kidneys unremarkable. No evidence for hydroureter. The urinary bladder appears normal for the degree of distention. Stomach/Bowel: Stomach is unremarkable. No gastric wall thickening. No evidence of outlet obstruction. Duodenum is normally positioned as is the ligament of Treitz. Duodenal diverticulum noted. No small bowel wall thickening. Proximal jejunal loops are nondilated. More distal small bowel in the pelvis is distended up to 2.7 cm diameter with fecalization of enteric contents suggesting decreased transit. Mesentery of the dilated bowel is congested/edematous. This most abnormal loop of small bowel tracks directly into a short segment of circumferential wall thickening and enhancement along the left pelvic sidewall (see axial 53/2 and coronal 36/4). Small bowel in the right pelvis appears more decompressed although fluid and gas is visible in the terminal ileum. The appendix is normal. No gross colonic mass. No colonic wall thickening. Vascular/Lymphatic: There is moderate atherosclerotic calcification of the abdominal aorta without aneurysm. There is no gastrohepatic or hepatoduodenal ligament lymphadenopathy. No retroperitoneal or mesenteric lymphadenopathy. Upper normal lymph nodes are seen in the small bowel mesentery of the pelvis.  Reproductive: Calcified uterine fibroid evident. There is no adnexal mass. Other: Trace free fluid is seen in the pelvis. Musculoskeletal: No worrisome lytic or sclerotic osseous abnormality. IMPRESSION: 1. Distal small bowel in the pelvis is distended up to 2.7 cm diameter with fecalization of enteric contents suggesting decreased transit. Mesentery of the dilated bowel is congested/edematous. This most abnormal loop of small bowel tracks directly into a short segment of luminal narrowing secondary to circumferential wall thickening and enhancement along the left pelvic sidewall. Small bowel in the right pelvis appears more decompressed although fluid and gas is visible in the terminal ileum. Imaging features suggest small bowel obstruction potentially due to inflammatory stricture although neoplastic etiology is not excluded. Findings could also reflect mechanical obstruction due to adhesion with secondary wall thickening and hyperenhancement in the small bowel. 2. Trace free fluid in the pelvis. 3. Status post cholecystectomy with dilatation of the common bile duct up to about 11 mm diameter in the head of the pancreas with mild intrahepatic biliary duct prominence. Correlation with liver function tests recommended. 4. Calcified uterine fibroid. 5.  Aortic Atherosclerosis (ICD10-I70.0). Electronically Signed   By: Donnal Fusi M.D.   On: 07/26/2023 06:23    Pending Labs Unresulted Labs (From admission, onward)     Start     Ordered   08/02/23 0500  Creatinine, serum  (enoxaparin (LOVENOX)    CrCl >/= 30 ml/min)  Weekly,   R     Comments: while on enoxaparin therapy    07/26/23 0757   07/27/23 0500  Magnesium  Tomorrow morning,   R        07/26/23 0757   07/27/23 0500  Comprehensive metabolic panel  Tomorrow morning,   R        07/26/23 0757   07/27/23 0500  CBC  Tomorrow morning,   R        07/26/23 0757   07/26/23 0757  HIV Antibody (routine testing w rflx)  (HIV Antibody (Routine testing w  reflex) panel)  Once,   R        07/26/23 0757            Vitals/Pain Today's Vitals   07/26/23 0900 07/26/23 0915 07/26/23 0930 07/26/23 0945  BP: (!) 163/98 (!) 163/83 (!) 177/93 (!) 187/99  Pulse: (!) 49 (!) 46 (!) 49 (!) 54  Resp: 16 18 18 17   Temp:      TempSrc:      SpO2: 99% 97% 98% 97%  Weight:      PainSc:        Isolation Precautions No active isolations  Medications Medications  potassium chloride 10 mEq in 100 mL IVPB (0 mEq Intravenous Stopped 07/26/23 0917)  lactated ringers  infusion ( Intravenous Bolus from Bag 07/26/23 0735)  HYDROmorphone (DILAUDID) injection 0.5 mg (0.5 mg Intravenous Given 07/26/23 0742)  ondansetron  (ZOFRAN ) injection 4 mg (4 mg Intravenous Given 07/26/23 0741)  enoxaparin (LOVENOX) injection 40 mg (40 mg Subcutaneous Given 07/26/23 0923)  acetaminophen  (TYLENOL ) tablet 650 mg (has no administration in time range)    Or  acetaminophen  (TYLENOL ) suppository 650 mg (has no administration in time range)  pantoprazole  (PROTONIX ) injection 40 mg (40 mg Intravenous Given 07/26/23 0923)  LORazepam  (ATIVAN ) injection 1 mg (has no administration in time range)  sodium chloride  0.9 % bolus 1,000 mL (0 mLs Intravenous Stopped 07/26/23 0527)  ondansetron  (ZOFRAN ) injection 4 mg (4 mg Intravenous Given 07/26/23 0421)  HYDROmorphone (DILAUDID) injection 1 mg (1 mg Intravenous Given 07/26/23 0421)  iohexol (OMNIPAQUE) 300 MG/ML solution 100 mL (100 mLs Intravenous Contrast Given 07/26/23 0527)    Mobility walks     Focused Assessments    R Recommendations: See Admitting Provider Note  Report given to:   Additional Notes:

## 2023-07-26 NOTE — H&P (Signed)
 History and Physical    Brooke [name] ZOX:[id number] DOB: Jun 11, 1970 DOA: 07/26/2023  PCP: Levada Raymond, PA   Patient coming from: Home  Chief Complaint: Abdominal pain with nausea and vomiting  HPI: Brooke [name] is a 53 y.o. female with medical history significant for chronic hepatitis C, GERD, fatty liver, and chronic pain who presented to the ED with epigastric abdominal pain along with nausea and vomiting that began yesterday evening, but worsened into earlier this morning.  She states that her last bowel movement was several days ago and she states that she has passing some flatus.  She denies any fevers or chills or radiation of the pain.  She states that her symptoms are consistent with when she had gallstones and required cholecystectomy many years ago.   ED Course: Vital signs stable and patient afebrile.  Labs with potassium 3.4.  CT scan with findings of SBO noted and some CBD dilation with prior evidence of cholecystectomy.  Patient has been given IV fluid bolus as well as Zofran  and Dilaudid in the ED.  She is currently complaining of some heartburn.  Review of Systems: Reviewed as noted above, otherwise negative.  Past Medical History:  Diagnosis Date   Anxiety    Asthmatic bronchitis    Chronic pain    Depression    Hepatitis C    genoptype 1 a, s/p treatment with Epclusa.   Stroke [facility])    Pt reported TIA 04/27/10    Past Surgical History:  Procedure Laterality Date   CHOLECYSTECTOMY  2007   COLONOSCOPY WITH PROPOFOL  N/A 04/19/2023   Procedure: COLONOSCOPY WITH PROPOFOL ;  Surgeon: Vinetta Greening, DO;  [facility];  Service: Endoscopy;  Laterality: N/A;  9:30 am, asa 2   FOOT SURGERY     x3 on right and 1 on left.   KNEE SURGERY     OVARY SURGERY     right oophrectomy   POLYPECTOMY  04/19/2023   Procedure: POLYPECTOMY;  Surgeon: Vinetta Greening, DO;  [facility];  Service: Endoscopy;;   TUBAL LIGATION       reports that she  has been smoking cigarettes. She has never used smokeless tobacco. She reports current drug use. Drug: Marijuana. She reports that she does not drink alcohol.  No Known Allergies  Family History  Problem Relation Age of Onset   Hypertension Mother    Hyperlipidemia Mother    Depression Mother    Cancer Father    Hypertension Father    Hyperlipidemia Father    Cirrhosis Father        alcoholic   Hyperlipidemia Sister    Hyperlipidemia Brother    Depression Brother    Hepatitis C Brother    Colon cancer Neg Hx     Prior to Admission medications   Medication Sig Start Date End Date Taking? Authorizing Provider  polyethylene glycol-electrolytes (NULYTELY) 420 g solution Take 4,000 mLs by mouth once. 03/17/23  Yes [provider]  predniSONE  (DELTASONE ) 20 MG tablet Take 20 mg by mouth 2 (two) times daily. 07/05/23  Yes [provider]  traMADol  (ULTRAM ) 50 MG tablet Take 50 mg by mouth 2 (two) times daily as needed. 07/05/23  Yes [provider]  pantoprazole  (PROTONIX ) 40 MG tablet Take 1 tablet (40 mg total) by mouth daily. 03/17/23   Eustacio Highman, NP    Physical Exam: Vitals:   07/26/23 0445 07/26/23 0500 07/26/23 0745 07/26/23 0745  BP: 125/77  Aaron Aas)  154/88   Pulse: 75  (!) 55   Resp: 16 17    Temp:    98.8 F (37.1 C)  TempSrc:    Oral  SpO2: 97%  97%   Weight:        Constitutional: NAD, calm, comfortable Vitals:   07/26/23 0445 07/26/23 0500 07/26/23 0745 07/26/23 0745  BP: 125/77  (!) 154/88   Pulse: 75  (!) 55   Resp: 16 17    Temp:    98.8 F (37.1 C)  TempSrc:    Oral  SpO2: 97%  97%   Weight:       Eyes: lids and conjunctivae normal Neck: normal, supple Respiratory: clear to auscultation bilaterally. Normal respiratory effort. No accessory muscle use.  Cardiovascular: Regular rate and rhythm, no murmurs. Abdomen: no tenderness, no distention. Bowel sounds positive.  Musculoskeletal:  No edema. Skin: no rashes, lesions,  ulcers.  Psychiatric: Flat affect  Labs on Admission: I have personally reviewed following labs and imaging studies  CBC: Recent Labs  Lab 07/26/23 0415  WBC 10.4  NEUTROABS 7.9*  HGB 14.8  HCT 41.6  MCV 85.6  PLT 221   Basic Metabolic Panel: Recent Labs  Lab 07/26/23 0415  NA 138  K 3.4*  CL 102  CO2 22  GLUCOSE 108*  BUN 16  CREATININE 0.69  CALCIUM 9.6   GFR: Estimated Creatinine Clearance: 67 mL/min (by C-G formula based on SCr of 0.69 mg/dL). Liver Function Tests: Recent Labs  Lab 07/26/23 0415  AST 17  ALT 13  ALKPHOS 86  BILITOT 1.0  PROT 7.8  ALBUMIN 4.0   Recent Labs  Lab 07/26/23 0415  LIPASE 28   No results for input(s): AMMONIA in the last 168 hours. Coagulation Profile: No results for input(s): INR, PROTIME in the last 168 hours. Cardiac Enzymes: No results for input(s): CKTOTAL, CKMB, CKMBINDEX, TROPONINI in the last 168 hours. BNP (last 3 results) No results for input(s): PROBNP in the last 8760 hours. HbA1C: No results for input(s): HGBA1C in the last 72 hours. CBG: No results for input(s): GLUCAP in the last 168 hours. Lipid Profile: No results for input(s): CHOL, HDL, LDLCALC, TRIG, CHOLHDL, LDLDIRECT in the last 72 hours. Thyroid Function Tests: No results for input(s): TSH, T4TOTAL, FREET4, T3FREE, THYROIDAB in the last 72 hours. Anemia Panel: No results for input(s): VITAMINB12, FOLATE, FERRITIN, TIBC, IRON, RETICCTPCT in the last 72 hours. Urine analysis:    Component Value Date/Time   COLORURINE YELLOW 07/26/2023 0646   APPEARANCEUR CLEAR 07/26/2023 0646   LABSPEC <1.005 (L) 07/26/2023 0646   PHURINE 6.0 07/26/2023 0646   GLUCOSEU NEGATIVE 07/26/2023 0646   HGBUR NEGATIVE 07/26/2023 0646   BILIRUBINUR NEGATIVE 07/26/2023 0646   KETONESUR 40 (A) 07/26/2023 0646   PROTEINUR TRACE (A) 07/26/2023 0646   UROBILINOGEN 0.2 07/22/2014 1416   NITRITE NEGATIVE 07/26/2023  0646   LEUKOCYTESUR NEGATIVE 07/26/2023 0646    Radiological Exams on Admission: CT ABDOMEN PELVIS W CONTRAST Result Date: 07/26/2023 CLINICAL DATA:  Nonlocalized abdominal pain with nausea vomiting. EXAM: CT ABDOMEN AND PELVIS WITH CONTRAST TECHNIQUE: Multidetector CT imaging of the abdomen and pelvis was performed using the standard protocol following bolus administration of intravenous contrast. RADIATION DOSE REDUCTION: This exam was performed according to the departmental dose-optimization program which includes automated exposure control, adjustment of the mA and/or kV according to patient size and/or use of iterative reconstruction technique. CONTRAST:  100mL OMNIPAQUE IOHEXOL 300 MG/ML  SOLN COMPARISON:  07/22/2014 FINDINGS: Lower  chest: No acute findings. Hepatobiliary: No suspicious focal abnormality within the liver parenchyma. Cholecystectomy. Common bile duct is dilated up to about 11 mm diameter in the head of the pancreas with mild intrahepatic biliary duct prominence. Pancreas: No focal mass lesion. No dilatation of the main duct. No intraparenchymal cyst. No peripancreatic edema. Spleen: No splenomegaly. No suspicious focal mass lesion. Adrenals/Urinary Tract: No adrenal nodule or mass. Kidneys unremarkable. No evidence for hydroureter. The urinary bladder appears normal for the degree of distention. Stomach/Bowel: Stomach is unremarkable. No gastric wall thickening. No evidence of outlet obstruction. Duodenum is normally positioned as is the ligament of Treitz. Duodenal diverticulum noted. No small bowel wall thickening. Proximal jejunal loops are nondilated. More distal small bowel in the pelvis is distended up to 2.7 cm diameter with fecalization of enteric contents suggesting decreased transit. Mesentery of the dilated bowel is congested/edematous. This most abnormal loop of small bowel tracks directly into a short segment of circumferential wall thickening and enhancement along the left  pelvic sidewall (see axial 53/2 and coronal 36/4). Small bowel in the right pelvis appears more decompressed although fluid and gas is visible in the terminal ileum. The appendix is normal. No gross colonic mass. No colonic wall thickening. Vascular/Lymphatic: There is moderate atherosclerotic calcification of the abdominal aorta without aneurysm. There is no gastrohepatic or hepatoduodenal ligament lymphadenopathy. No retroperitoneal or mesenteric lymphadenopathy. Upper normal lymph nodes are seen in the small bowel mesentery of the pelvis. Reproductive: Calcified uterine fibroid evident. There is no adnexal mass. Other: Trace free fluid is seen in the pelvis. Musculoskeletal: No worrisome lytic or sclerotic osseous abnormality. IMPRESSION: 1. Distal small bowel in the pelvis is distended up to 2.7 cm diameter with fecalization of enteric contents suggesting decreased transit. Mesentery of the dilated bowel is congested/edematous. This most abnormal loop of small bowel tracks directly into a short segment of luminal narrowing secondary to circumferential wall thickening and enhancement along the left pelvic sidewall. Small bowel in the right pelvis appears more decompressed although fluid and gas is visible in the terminal ileum. Imaging features suggest small bowel obstruction potentially due to inflammatory stricture although neoplastic etiology is not excluded. Findings could also reflect mechanical obstruction due to adhesion with secondary wall thickening and hyperenhancement in the small bowel. 2. Trace free fluid in the pelvis. 3. Status post cholecystectomy with dilatation of the common bile duct up to about 11 mm diameter in the head of the pancreas with mild intrahepatic biliary duct prominence. Correlation with liver function tests recommended. 4. Calcified uterine fibroid. 5.  Aortic Atherosclerosis (ICD10-I70.0). Electronically Signed   By: Donnal Fusi M.D.   On: 07/26/2023 06:23     Assessment/Plan Principal Problem:   Small bowel obstruction (HCC) Active Problems:   Anxiety state   Chronic hepatitis C without hepatic coma (HCC)   Gastroesophageal reflux disease   Fatty liver   Chronic pain syndrome    SBO -Prior history of cholecystectomy and noted to have CBD dilation on CT scan -No LFT elevation noted - Keep n.p.o. and advance diet as tolerated - IV fluid - Zofran  as needed for nausea or vomiting - IV Dilaudid as needed for abdominal pain - Appreciate general surgery evaluation  Mild hypokalemia - Currently being repleted - Recheck in a.m.  GERD - IV PPI  History of chronic hepatitis C - Follows with GI outpatient - Treated with Epclusa with documented eradication 11/2021  Chronic pain - Multiple prior lower extremity surgeries and patient uses tramadol  at  home - Manage with IV pain medications for now until able to tolerate oral   DVT prophylaxis: Lovenox Code Status: Full Family Communication: None at bedside Disposition Plan: Admitted for evaluation of SBO Consults called:General Surgery Admission status: Inpatient, MedSurg  Severity of Illness: The appropriate patient status for this patient is INPATIENT. Inpatient status is judged to be reasonable and necessary in order to provide the required intensity of service to ensure the patient's safety. The patient's presenting symptoms, physical exam findings, and initial radiographic and laboratory data in the context of their chronic comorbidities is felt to place them at high risk for further clinical deterioration. Furthermore, it is not anticipated that the patient will be medically stable for discharge from the hospital within 2 midnights of admission.   * I certify that at the point of admission it is my clinical judgment that the patient will require inpatient hospital care spanning beyond 2 midnights from the point of admission due to high intensity of service, high risk for further  deterioration and high frequency of surveillance required.*   Zadie Deemer D Jhonny Calixto DO [facility]  If 7PM-7AM, please contact night-coverage [email or web address]  07/26/2023, 7:54 AM

## 2023-07-26 NOTE — ED Triage Notes (Signed)
Pt c/o abdominal pain and N/V.

## 2023-07-26 NOTE — Consult Note (Signed)
 Reason for Consult: Partial small bowel obstruction Referring Physician: Dr. Girard Brooke [name] is an 53 y.o. female.  HPI: Patient is a 53 year old white female with a medical history of chronic hepatitis C, GERD, fatty liver, chronic abdominal pain who presented to the emergency room with worsening nausea and vomiting.  She states her last bowel movement was several days ago, though she did have flatus while in the emergency room.  She states she has nonspecific abdominal pain throughout her abdomen but is not currently nauseated.  She states she was admitted to [facility] last year for similar episode which resolved on its own.  She has had a cholecystectomy as well as oophorectomy in the past.  She recently underwent a colonoscopy by Dr. Mordechai April which showed multiple polyps and 2 ulcerations in the terminal ileum.  Past Medical History:  Diagnosis Date   Anxiety    Asthmatic bronchitis    Chronic pain    Depression    Hepatitis C    genoptype 1 a, s/p treatment with Epclusa.   Stroke [facility])    Pt reported TIA 04/27/10    Past Surgical History:  Procedure Laterality Date   CHOLECYSTECTOMY  2007   COLONOSCOPY WITH PROPOFOL  N/A 04/19/2023   Procedure: COLONOSCOPY WITH PROPOFOL ;  Surgeon: Vinetta Greening, DO;  [facility];  Service: Endoscopy;  Laterality: N/A;  9:30 am, asa 2   FOOT SURGERY     x3 on right and 1 on left.   KNEE SURGERY     OVARY SURGERY     right oophrectomy   POLYPECTOMY  04/19/2023   Procedure: POLYPECTOMY;  Surgeon: Vinetta Greening, DO;  [facility];  Service: Endoscopy;;   TUBAL LIGATION      Family History  Problem Relation Age of Onset   Hypertension Mother    Hyperlipidemia Mother    Depression Mother    Cancer Father    Hypertension Father    Hyperlipidemia Father    Cirrhosis Father        alcoholic   Hyperlipidemia Sister    Hyperlipidemia Brother    Depression Brother    Hepatitis C Brother    Colon cancer Neg  Hx     Social History:  reports that she has been smoking cigarettes. She has never used smokeless tobacco. She reports current drug use. Drug: Marijuana. She reports that she does not drink alcohol.  Allergies: No Known Allergies  Medications: I have reviewed the patient's current medications. Prior to Admission:  Medications Prior to Admission  Medication Sig Dispense Refill Last Dose/Taking   pantoprazole  (PROTONIX ) 40 MG tablet Take 1 tablet (40 mg total) by mouth daily. 90 tablet 3 07/25/2023 Morning   traMADol  (ULTRAM ) 50 MG tablet Take 50 mg by mouth 2 (two) times daily as needed.   Past Week   predniSONE  (DELTASONE ) 20 MG tablet Take 20 mg by mouth 2 (two) times daily. (Patient not taking: Reported on 07/26/2023)   Not Taking    Results for orders placed or performed during the hospital encounter of 07/26/23 (from the past 48 hours)  CBC with Differential/Platelet     Status: Abnormal   Collection Time: 07/26/23  4:15 AM  Result Value Ref Range   WBC 10.4 4.0 - 10.5 K/uL   RBC 4.86 3.87 - 5.11 MIL/uL   Hemoglobin 14.8 12.0 - 15.0 g/dL   HCT 60.4 54.0 - 98.1 %   MCV 85.6 80.0 - 100.0 fL  MCH 30.5 26.0 - 34.0 pg   MCHC 35.6 30.0 - 36.0 g/dL   RDW 95.6 21.3 - 08.6 %   Platelets 221 150 - 400 K/uL   nRBC 0.0 0.0 - 0.2 %   Neutrophils Relative % 76 %   Neutro Abs 7.9 (H) 1.7 - 7.7 K/uL   Lymphocytes Relative 18 %   Lymphs Abs 1.9 0.7 - 4.0 K/uL   Monocytes Relative 5 %   Monocytes Absolute 0.5 0.1 - 1.0 K/uL   Eosinophils Relative 0 %   Eosinophils Absolute 0.0 0.0 - 0.5 K/uL   Basophils Relative 1 %   Basophils Absolute 0.1 0.0 - 0.1 K/uL   Immature Granulocytes 0 %   Abs Immature Granulocytes 0.03 0.00 - 0.07 K/uL    Comment: Performed at [facility], [address] [place], [place] [address]  Comprehensive metabolic panel with GFR     Status: Abnormal   Collection Time: 07/26/23  4:15 AM  Result Value Ref Range   Sodium 138 135 - 145 mmol/L   Potassium 3.4 (L) 3.5  - 5.1 mmol/L   Chloride 102 98 - 111 mmol/L   CO2 22 22 - 32 mmol/L   Glucose, Bld 108 (H) 70 - 99 mg/dL    Comment: Glucose reference range applies only to samples taken after fasting for at least 8 hours.   BUN 16 6 - 20 mg/dL   Creatinine, Ser 9.62 0.44 - 1.00 mg/dL   Calcium 9.6 8.9 - 95.2 mg/dL   Total Protein 7.8 6.5 - 8.1 g/dL   Albumin 4.0 3.5 - 5.0 g/dL   AST 17 15 - 41 U/L   ALT 13 0 - 44 U/L   Alkaline Phosphatase 86 38 - 126 U/L   Total Bilirubin 1.0 0.0 - 1.2 mg/dL   GFR, Estimated >84 >13 mL/min    Comment: (NOTE) Calculated using the CKD-EPI Creatinine Equation (2021)    Anion gap 14 5 - 15    Comment: Performed at [facility], [address], [place], [place] [address]  Lipase, blood     Status: None   Collection Time: 07/26/23  4:15 AM  Result Value Ref Range   Lipase 28 11 - 51 U/L    Comment: Performed at [facility], [address] [place], [place] [address]  Urinalysis, Routine w reflex microscopic -Urine, Clean Catch     Status: Abnormal   Collection Time: 07/26/23  6:46 AM  Result Value Ref Range   Color, Urine YELLOW YELLOW   APPearance CLEAR CLEAR   Specific Gravity, Urine <1.005 (L) 1.005 - 1.030   pH 6.0 5.0 - 8.0   Glucose, UA NEGATIVE NEGATIVE mg/dL   Hgb urine dipstick NEGATIVE NEGATIVE   Bilirubin Urine NEGATIVE NEGATIVE   Ketones, ur 40 (A) NEGATIVE mg/dL   Protein, ur TRACE (A) NEGATIVE mg/dL   Nitrite NEGATIVE NEGATIVE   Leukocytes,Ua NEGATIVE NEGATIVE    Comment: Performed at [facility], [address], [place], [place] [address]  Urinalysis, Microscopic (reflex)     Status: None   Collection Time: 07/26/23  6:46 AM  Result Value Ref Range   RBC / HPF NONE SEEN 0 - 5 RBC/hpf   WBC, UA 0-5 0 - 5 WBC/hpf   Bacteria, UA NONE SEEN NONE SEEN   Squamous Epithelial / HPF NONE SEEN 0 - 5 /HPF    Comment: Performed at [facility], [address], [place], [place] [address]    CT ABDOMEN PELVIS W  CONTRAST Result Date: 07/26/2023 CLINICAL  DATA:  Nonlocalized abdominal pain with nausea vomiting. EXAM: CT ABDOMEN AND PELVIS WITH CONTRAST TECHNIQUE: Multidetector CT imaging of the abdomen and pelvis was performed using the standard protocol following bolus administration of intravenous contrast. RADIATION DOSE REDUCTION: This exam was performed according to the departmental dose-optimization program which includes automated exposure control, adjustment of the mA and/or kV according to patient size and/or use of iterative reconstruction technique. CONTRAST:  OMNIPAQUE IOHEXOL 300 MG/ML  SOLN COMPARISON:  07/22/2014 FINDINGS: Lower chest: No acute findings. Hepatobiliary: No suspicious focal abnormality within the liver parenchyma. Cholecystectomy. Common bile duct is dilated up to about 11 mm diameter in the head of the pancreas with mild intrahepatic biliary duct prominence. Pancreas: No focal mass lesion. No dilatation of the main duct. No intraparenchymal cyst. No peripancreatic edema. Spleen: No splenomegaly. No suspicious focal mass lesion. Adrenals/Urinary Tract: No adrenal nodule or mass. Kidneys unremarkable. No evidence for hydroureter. The urinary bladder appears normal for the degree of distention. Stomach/Bowel: Stomach is unremarkable. No gastric wall thickening. No evidence of outlet obstruction. Duodenum is normally positioned as is the ligament of Treitz. Duodenal diverticulum noted. No small bowel wall thickening. Proximal jejunal loops are nondilated. More distal small bowel in the pelvis is distended up to 2.7 cm diameter with fecalization of enteric contents suggesting decreased transit. Mesentery of the dilated bowel is congested/edematous. This most abnormal loop of small bowel tracks directly into a short segment of circumferential wall thickening and enhancement along the left pelvic sidewall (see axial 53/2 and coronal 36/4). Small bowel in the right pelvis appears more decompressed although fluid and gas is visible in the  terminal ileum. The appendix is normal. No gross colonic mass. No colonic wall thickening. Vascular/Lymphatic: There is moderate atherosclerotic calcification of the abdominal aorta without aneurysm. There is no gastrohepatic or hepatoduodenal ligament lymphadenopathy. No retroperitoneal or mesenteric lymphadenopathy. Upper normal lymph nodes are seen in the small bowel mesentery of the pelvis. Reproductive: Calcified uterine fibroid evident. There is no adnexal mass. Other: Trace free fluid is seen in the pelvis. Musculoskeletal: No worrisome lytic or sclerotic osseous abnormality. IMPRESSION: 1. Distal small bowel in the pelvis is distended up to 2.7 cm diameter with fecalization of enteric contents suggesting decreased transit. Mesentery of the dilated bowel is congested/edematous. This most abnormal loop of small bowel tracks directly into a short segment of luminal narrowing secondary to circumferential wall thickening and enhancement along the left pelvic sidewall. Small bowel in the right pelvis appears more decompressed although fluid and gas is visible in the terminal ileum. Imaging features suggest small bowel obstruction potentially due to inflammatory stricture although neoplastic etiology is not excluded. Findings could also reflect mechanical obstruction due to adhesion with secondary wall thickening and hyperenhancement in the small bowel. 2. Trace free fluid in the pelvis. 3. Status post cholecystectomy with dilatation of the common bile duct up to about 11 mm diameter in the head of the pancreas with mild intrahepatic biliary duct prominence. Correlation with liver function tests recommended. 4. Calcified uterine fibroid. 5.  Aortic Atherosclerosis (ICD10-I70.0). Electronically Signed   By: Donnal Fusi M.D.   On: 07/26/2023 06:23    ROS:  Pertinent items are noted in HPI.  Blood pressure (!) 176/90, pulse 66, temperature 98.3 F (36.8 C), temperature source Oral, resp. rate 14, weight 50.1  kg, last menstrual period 05/26/2016, SpO2 98%. Physical Exam: Pleasant white female in no acute distress Head is normocephalic, atraumatic Lungs clear  to auscultation with equal breath sounds bilaterally Heart examination reveals regular rate and rhythm without S3, S4, murmurs Abdomen is soft and flat with no specific point tenderness or rigidity noted.  Minimal bowel sounds are appreciated.  No distention is appreciated.  CT scan images personally reviewed  Assessment/Plan: Impression: Partial small bowel obstruction of unknown etiology.  Patient may be having an episode of enteritis like she has had in the past.  In addition, she may have adhesions present in the pelvis.  No need for acute surgical intervention at the present time.  Will follow with you.  I do suggest GI consultation given the colonoscopy findings in the past and her history of enteritis.  There is also recommended to get an MRCP to evaluate the dilated hepatobiliary tree.  This was discussed with Dr. Mason Sole.  Alanda Allegra 07/26/2023, 12:12 PM

## 2023-07-26 NOTE — ED Provider Notes (Signed)
 [place] EMERGENCY DEPARTMENT AT [facility] Provider Note   CSN: [id number] Arrival date & time: 07/26/23  0320     Patient presents with: Abdominal Pain   Brooke [name] is a 53 y.o. female.   Patient presents to the emergency department for evaluation of abdominal pain.  Patient with upper abdominal pain, nausea and vomiting.  Patient reports that she has had her gallbladder out but this pain does feel similar to when she had gallstones.       Prior to Admission medications   Medication Sig Start Date End Date Taking? Authorizing Provider  pantoprazole  (PROTONIX ) 40 MG tablet Take 1 tablet (40 mg total) by mouth daily. 03/17/23   Eustacio Highman, NP    Allergies: Patient has no known allergies.    Review of Systems  Updated Vital Signs BP 125/77   Pulse 75   Temp (P) 98.5 F (36.9 C) (Oral)   Resp 17   Wt 52.2 kg   LMP 05/26/2016   SpO2 97%   BMI 18.01 kg/m   Physical Exam Vitals and nursing note reviewed.  Constitutional:      General: She is not in acute distress.    Appearance: She is well-developed.  HENT:     Head: Normocephalic and atraumatic.     Mouth/Throat:     Mouth: Mucous membranes are moist.   Eyes:     General: Vision grossly intact. Gaze aligned appropriately.     Extraocular Movements: Extraocular movements intact.     Conjunctiva/sclera: Conjunctivae normal.    Cardiovascular:     Rate and Rhythm: Normal rate and regular rhythm.     Pulses: Normal pulses.     Heart sounds: Normal heart sounds, S1 normal and S2 normal. No murmur heard.    No friction rub. No gallop.  Pulmonary:     Effort: Pulmonary effort is normal. No respiratory distress.     Breath sounds: Normal breath sounds.  Abdominal:     General: Bowel sounds are decreased.     Palpations: Abdomen is soft.     Tenderness: There is generalized abdominal tenderness. There is no guarding or rebound.     Hernia: No hernia is present.   Musculoskeletal:         General: No swelling.     Cervical back: Full passive range of motion without pain, normal range of motion and neck supple. No spinous process tenderness or muscular tenderness. Normal range of motion.     Right lower leg: No edema.     Left lower leg: No edema.   Skin:    General: Skin is warm and dry.     Capillary Refill: Capillary refill takes less than 2 seconds.     Findings: No ecchymosis, erythema, rash or wound.   Neurological:     General: No focal deficit present.     Mental Status: She is alert and oriented to person, place, and time.     GCS: GCS eye subscore is 4. GCS verbal subscore is 5. GCS motor subscore is 6.     Cranial Nerves: Cranial nerves 2-12 are intact.     Sensory: Sensation is intact.     Motor: Motor function is intact.     Coordination: Coordination is intact.   Psychiatric:        Attention and Perception: Attention normal.        Mood and Affect: Mood normal.        Speech: Speech  normal.        Behavior: Behavior normal.     (all labs ordered are listed, but only abnormal results are displayed) Labs Reviewed  CBC WITH DIFFERENTIAL/PLATELET - Abnormal; Notable for the following components:      Result Value   Neutro Abs 7.9 (*)    All other components within normal limits  COMPREHENSIVE METABOLIC PANEL WITH GFR - Abnormal; Notable for the following components:   Potassium 3.4 (*)    Glucose, Bld 108 (*)    All other components within normal limits  LIPASE, BLOOD  URINALYSIS, ROUTINE W REFLEX MICROSCOPIC    EKG: None  Radiology: CT ABDOMEN PELVIS W CONTRAST Result Date: 07/26/2023 CLINICAL DATA:  Nonlocalized abdominal pain with nausea vomiting. EXAM: CT ABDOMEN AND PELVIS WITH CONTRAST TECHNIQUE: Multidetector CT imaging of the abdomen and pelvis was performed using the standard protocol following bolus administration of intravenous contrast. RADIATION DOSE REDUCTION: This exam was performed according to the departmental  dose-optimization program which includes automated exposure control, adjustment of the mA and/or kV according to patient size and/or use of iterative reconstruction technique. CONTRAST:  OMNIPAQUE IOHEXOL 300 MG/ML  SOLN COMPARISON:  07/22/2014 FINDINGS: Lower chest: No acute findings. Hepatobiliary: No suspicious focal abnormality within the liver parenchyma. Cholecystectomy. Common bile duct is dilated up to about 11 mm diameter in the head of the pancreas with mild intrahepatic biliary duct prominence. Pancreas: No focal mass lesion. No dilatation of the main duct. No intraparenchymal cyst. No peripancreatic edema. Spleen: No splenomegaly. No suspicious focal mass lesion. Adrenals/Urinary Tract: No adrenal nodule or mass. Kidneys unremarkable. No evidence for hydroureter. The urinary bladder appears normal for the degree of distention. Stomach/Bowel: Stomach is unremarkable. No gastric wall thickening. No evidence of outlet obstruction. Duodenum is normally positioned as is the ligament of Treitz. Duodenal diverticulum noted. No small bowel wall thickening. Proximal jejunal loops are nondilated. More distal small bowel in the pelvis is distended up to 2.7 cm diameter with fecalization of enteric contents suggesting decreased transit. Mesentery of the dilated bowel is congested/edematous. This most abnormal loop of small bowel tracks directly into a short segment of circumferential wall thickening and enhancement along the left pelvic sidewall (see axial 53/2 and coronal 36/4). Small bowel in the right pelvis appears more decompressed although fluid and gas is visible in the terminal ileum. The appendix is normal. No gross colonic mass. No colonic wall thickening. Vascular/Lymphatic: There is moderate atherosclerotic calcification of the abdominal aorta without aneurysm. There is no gastrohepatic or hepatoduodenal ligament lymphadenopathy. No retroperitoneal or mesenteric lymphadenopathy. Upper normal lymph  nodes are seen in the small bowel mesentery of the pelvis. Reproductive: Calcified uterine fibroid evident. There is no adnexal mass. Other: Trace free fluid is seen in the pelvis. Musculoskeletal: No worrisome lytic or sclerotic osseous abnormality. IMPRESSION: 1. Distal small bowel in the pelvis is distended up to 2.7 cm diameter with fecalization of enteric contents suggesting decreased transit. Mesentery of the dilated bowel is congested/edematous. This most abnormal loop of small bowel tracks directly into a short segment of luminal narrowing secondary to circumferential wall thickening and enhancement along the left pelvic sidewall. Small bowel in the right pelvis appears more decompressed although fluid and gas is visible in the terminal ileum. Imaging features suggest small bowel obstruction potentially due to inflammatory stricture although neoplastic etiology is not excluded. Findings could also reflect mechanical obstruction due to adhesion with secondary wall thickening and hyperenhancement in the small bowel. 2. Trace free fluid  in the pelvis. 3. Status post cholecystectomy with dilatation of the common bile duct up to about 11 mm diameter in the head of the pancreas with mild intrahepatic biliary duct prominence. Correlation with liver function tests recommended. 4. Calcified uterine fibroid. 5.  Aortic Atherosclerosis (ICD10-I70.0). Electronically Signed   By: Donnal Fusi M.D.   On: 07/26/2023 06:23     Procedures   Medications Ordered in the ED  sodium chloride  0.9 % bolus 1,000 mL (0 mLs Intravenous Stopped 07/26/23 0527)  ondansetron  (ZOFRAN ) injection 4 mg (4 mg Intravenous Given 07/26/23 0421)  HYDROmorphone (DILAUDID) injection 1 mg (1 mg Intravenous Given 07/26/23 0421)  iohexol (OMNIPAQUE) 300 MG/ML solution 100 mL (100 mLs Intravenous Contrast Given 07/26/23 0527)                                    Medical Decision Making Amount and/or Complexity of Data Reviewed Labs:  ordered. Radiology: ordered.  Risk Prescription drug management.   Differential Diagnosis considered includes, but not limited to: Choledocholithiasis; cholangitis; bowel obstruction; esophagitis; gastritis; peptic ulcer disease; pancreatitis; cardiac.  Patient presents to the emergency department for evaluation of upper abdominal pain.  She has had associated nausea and vomiting.  Patient does have a history of colitis and patient feels that this does feel similar.  She also, however, reports that it feels similar to when she had gallstones.  She has had a prior cholecystectomy.  Lab work is unremarkable.  CT scan was performed to further evaluate.  She does have findings consistent with small bowel obstruction secondary to adhesions.  Additionally she has dilated common bile duct without a visualized stone.  This may be normal after cholecystectomy as her LFTs and lipase are normal.  Findings discussed with Dr. Larrie Po, on-call for surgery.  He will consult on the patient, make further recommendations.  Patient to be admitted to the medicine service.     Final diagnoses:  Small bowel obstruction due to adhesions [facility])    ED Discharge Orders     None          Ballard Bongo, MD 07/26/23 [id number]

## 2023-07-26 NOTE — ED Notes (Signed)
 Patient transported to CT

## 2023-07-26 NOTE — Consult Note (Signed)
 @LOGO @   Referring Provider: [facility] hospitalist Primary Care Physician:  Levada Raymond, [place] Primary Gastroenterologist:  Dr. Mordechai April  Date of Admission: 07/26/23 Date of Consultation: 07/26/23  Reason for Consultation:  History of ielitis and now with SBO  HPI:  Brooke [name] is a 53 y.o. year old female with history of anxiety, depression, asthma, TIA in 2012, chronic pain, hepatitis C s/p treatment with Epclusa achieving SVR, GERD, adenomatous colon polyps, nonspecific ileitis noted on colonoscopy in March 2025, who presented to the emergency room today with complaint of abdominal pain, nausea, vomiting.  ED course: Hypertensive with BP up to 180s over 90s.  Mild bradycardia with heart rate in the 50s.  Otherwise, vital signs within normal limits. Labs remarkable for potassium 3.4.  Creatinine, LFTs, white blood cell count, hemoglobin all within normal limits.  CT A/P with contrast showing dilation of distal small bowel with fecalization tracking directly into a short segment of luminal narrowing secondary to circumferential wall thickening and enhancement along the left pelvic sidewall with associated congested/edematous mesentery. Findings are suggestive of small bowel obstruction possibly due to inflammatory stricture although neoplastic etiology not excluded.  Findings could also reflect mechanical obstruction due to adhesion with secondary wall thickening and hyperenhancement in the small bowel.  Also with evidence of prior cholecystectomy with dilated CBD up to 11 mm with mild intrahepatic biliary duct prominence.  Trace free fluid in the pelvis.  MRI/MRCP has been ordered by hospitalist to follow-up on CBD dilation.    Today:  Acute onset diarrhea for 2 days, then developed generalized abdominal burning yesterday that was unrelenting. Took extra pantoprazole  2 pepcid AC, and baking soda in a 24 hour period, but no improvement in symptoms and vomited after taking baking soda.  Wasn't  able to move her bowels yesterday. Presented to the ER today due to persistent. Vomited in the ER.   Abdominal pain was improved by Dilaudid and nausea/vomiting resolved with Zofran .  She has not had a bowel movement today, but is passing gas.  Reports she has been having some increased frequency of generalized abdominal burning over the last few months.  Seems to be triggered by acidic foods- orange, BBQ sauce.   Denies any chronic issues with her bowels.  Was having normal, formed stools prior to this.  Denies BRBPR or melena.   NSAIDs:  Reports no NSAIDs in at least 1 month, but previously was taking ibuprofen  fairly frequently for a headache or other pain.    No family history of IBD.   Chronic GERD well controlled on Protonix  40 mg daily.     Last colonoscopy 04/19/2023 - Diverticulosis in the entire examined colon.  - One 5 mm polyp in the distal rectum, removed with a cold snare. Resected and retrieved.  - Six 3 to 6 mm polyps in the proximal rectum and in the sigmoid colon, removed with a cold snare.  - 2 ulcers in the terminal ileum. Biopsied.Resected and retrieved.  A. COLON, TERMINAL ILEUM, BIOPSY:       Active ileitis.       No evidence of chronicity, granuloma dysplasia or malignancy.   B. COLON, SIGMOID, RECTUM, POLYPECTOMY:       Hyperplastic polyp.       Negative for dysplasia.   C. RECTUM, DISTAL, BIOPSY:       Tubular adenoma.       Negative for high-grade dysplasia.    Past Medical History:  Diagnosis Date   Anxiety  Asthmatic bronchitis    Chronic pain    Depression    Hepatitis C    genoptype 1 a, s/p treatment with Epclusa.   Stroke [facility])    Pt reported TIA 04/27/10    Past Surgical History:  Procedure Laterality Date   CHOLECYSTECTOMY  2007   COLONOSCOPY WITH PROPOFOL  N/A 04/19/2023   Procedure: COLONOSCOPY WITH PROPOFOL ;  Surgeon: Vinetta Greening, DO;  Location: [facility];  Service: Endoscopy;  Laterality: N/A;  9:30 am, asa 2    FOOT SURGERY     x3 on right and 1 on left.   KNEE SURGERY     OVARY SURGERY     right oophrectomy   POLYPECTOMY  04/19/2023   Procedure: POLYPECTOMY;  Surgeon: Vinetta Greening, DO;  Location: [facility];  Service: Endoscopy;;   TUBAL LIGATION      Prior to Admission medications   Medication Sig Start Date End Date Taking? Authorizing Provider  pantoprazole  (PROTONIX ) 40 MG tablet Take 1 tablet (40 mg total) by mouth daily. 03/17/23  Yes Mahon, Martine Sleek, NP  traMADol  (ULTRAM ) 50 MG tablet Take 50 mg by mouth 2 (two) times daily as needed. 07/05/23  Yes [provider]  predniSONE  (DELTASONE ) 20 MG tablet Take 20 mg by mouth 2 (two) times daily. Patient not taking: Reported on 07/26/2023 07/05/23   [provider]    Current Facility-Administered Medications  Medication Dose Route Frequency Provider Last Rate Last Admin   acetaminophen  (TYLENOL ) tablet 650 mg  650 mg Oral Q6H PRN Mason Sole, Pratik D, DO       Or   acetaminophen  (TYLENOL ) suppository 650 mg  650 mg Rectal Q6H PRN Mason Sole, Pratik D, DO       enoxaparin (LOVENOX) injection 40 mg  40 mg Subcutaneous Q24H Mason Sole, Pratik D, DO   40 mg at 07/26/23 0923   HYDROmorphone (DILAUDID) injection 0.5 mg  0.5 mg Intravenous Q3H PRN Shah, Pratik D, DO   0.5 mg at 07/26/23 1610   lactated ringers  infusion   Intravenous Continuous Doreene Gammon D, DO 75 mL/hr at 07/26/23 0735 Bolus from Bag at 07/26/23 0735   ondansetron  (ZOFRAN ) injection 4 mg  4 mg Intravenous Q6H PRN Shah, Pratik D, DO   4 mg at 07/26/23 0741   pantoprazole  (PROTONIX ) injection 40 mg  40 mg Intravenous Q24H Mason Sole, Pratik D, DO   40 mg at 07/26/23 9604   Current Outpatient Medications  Medication Sig Dispense Refill   pantoprazole  (PROTONIX ) 40 MG tablet Take 1 tablet (40 mg total) by mouth daily. 90 tablet 3   traMADol  (ULTRAM ) 50 MG tablet Take 50 mg by mouth 2 (two) times daily as needed.     predniSONE  (DELTASONE ) 20 MG tablet Take 20 mg by mouth 2 (two)  times daily. (Patient not taking: Reported on 07/26/2023)      Allergies as of 07/26/2023   (No Known Allergies)    Family History  Problem Relation Age of Onset   Hypertension Mother    Hyperlipidemia Mother    Depression Mother    Cancer Father    Hypertension Father    Hyperlipidemia Father    Cirrhosis Father        alcoholic   Hyperlipidemia Sister    Hyperlipidemia Brother    Depression Brother    Hepatitis C Brother    Colon cancer Neg Hx     Social History   Socioeconomic History   Marital status: Single  Spouse name: Not on file   Number of children: Not on file   Years of education: Not on file   Highest education level: Not on file  Occupational History   Not on file  Tobacco Use   Smoking status: Every Day    Current packs/day: 1.00    Types: Cigarettes   Smokeless tobacco: Never  Vaping Use   Vaping status: Never Used  Substance and Sexual Activity   Alcohol use: No    Comment: None in 3 years   Drug use: Yes    Types: Marijuana    Comment: pain pills previously, none since 2019.   Sexual activity: Yes    Birth control/protection: Surgical  Other Topics Concern   Not on file  Social History Narrative   Not on file   Social Drivers of Health   Financial Resource Strain: Not on file  Food Insecurity: Not on file  Transportation Needs: Not on file  Physical Activity: Not on file  Stress: Not on file  Social Connections: Not on file  Intimate Partner Violence: Not on file    Review of Systems: Gen: Denies fever, chills, cold or flulike symptoms, presyncope, syncope. CV: Denies chest pain, heart palpitations. Resp: Denies shortness of breath, cough. GI: See GU : Denies urinary burning, urinary frequency, urinary incontinence.  MS: Denies joint pain. Derm: Denies rash. Psych: Denies depression, anxiety. Heme: See HPI  Physical Exam: Vital signs in last 24 hours: Temp:  [98.5 F (36.9 C)-98.8 F (37.1 C)] 98.8 F (37.1 C) (06/17  0745) Pulse Rate:  [46-75] 54 (06/17 0945) Resp:  [16-18] 17 (06/17 0945) BP: (125-197)/(72-99) 187/99 (06/17 0945) SpO2:  [96 %-99 %] 97 % (06/17 0945) Weight:  [52.2 kg] 52.2 kg (06/17 0329)   General:   Alert,  Well-developed, well-nourished, pleasant and cooperative in NAD Head:  Normocephalic and atraumatic. Eyes:  Sclera clear, no icterus.   Conjunctiva pink. Ears:  Normal auditory acuity. Lungs:  Clear throughout to auscultation.   No wheezes, crackles, or rhonchi. No acute distress. Heart:  Regular rate and rhythm; no murmurs, clicks, rubs,  or gallops. Abdomen:  Soft, and nondistended.  Mild TTP across lower abdomen, primarily in RLQ region.  No masses, hepatosplenomegaly or hernias noted. Normal bowel sounds, without guarding, and without rebound.   Rectal:  Deferred   Msk:  Symmetrical without gross deformities. Normal posture. Extremities:  Without edema. Neurologic:  Alert and  oriented x4;  grossly normal neurologically. Skin:  Intact without significant lesions or rashes. Psych:  Normal mood and affect.  Intake/Output from previous day: 06/16 0701 - 06/17 0700 In: 1000 [IV Piggyback:1000] Out: -  Intake/Output this shift: Total I/O In: 86.1 [IV Piggyback:86.1] Out: -   Lab Results: Recent Labs    07/26/23 0415  WBC 10.4  HGB 14.8  HCT 41.6  PLT 221   BMET Recent Labs    07/26/23 0415  NA 138  K 3.4*  CL 102  CO2 22  GLUCOSE 108*  BUN 16  CREATININE 0.69  CALCIUM 9.6   LFT Recent Labs    07/26/23 0415  PROT 7.8  ALBUMIN 4.0  AST 17  ALT 13  ALKPHOS 86  BILITOT 1.0    Studies/Results: CT ABDOMEN PELVIS W CONTRAST Result Date: 07/26/2023 CLINICAL DATA:  Nonlocalized abdominal pain with nausea vomiting. EXAM: CT ABDOMEN AND PELVIS WITH CONTRAST TECHNIQUE: Multidetector CT imaging of the abdomen and pelvis was performed using the standard protocol following bolus administration of intravenous  contrast. RADIATION DOSE REDUCTION: This exam was  performed according to the departmental dose-optimization program which includes automated exposure control, adjustment of the mA and/or kV according to patient size and/or use of iterative reconstruction technique. CONTRAST:  OMNIPAQUE IOHEXOL 300 MG/ML  SOLN COMPARISON:  07/22/2014 FINDINGS: Lower chest: No acute findings. Hepatobiliary: No suspicious focal abnormality within the liver parenchyma. Cholecystectomy. Common bile duct is dilated up to about 11 mm diameter in the head of the pancreas with mild intrahepatic biliary duct prominence. Pancreas: No focal mass lesion. No dilatation of the main duct. No intraparenchymal cyst. No peripancreatic edema. Spleen: No splenomegaly. No suspicious focal mass lesion. Adrenals/Urinary Tract: No adrenal nodule or mass. Kidneys unremarkable. No evidence for hydroureter. The urinary bladder appears normal for the degree of distention. Stomach/Bowel: Stomach is unremarkable. No gastric wall thickening. No evidence of outlet obstruction. Duodenum is normally positioned as is the ligament of Treitz. Duodenal diverticulum noted. No small bowel wall thickening. Proximal jejunal loops are nondilated. More distal small bowel in the pelvis is distended up to 2.7 cm diameter with fecalization of enteric contents suggesting decreased transit. Mesentery of the dilated bowel is congested/edematous. This most abnormal loop of small bowel tracks directly into a short segment of circumferential wall thickening and enhancement along the left pelvic sidewall (see axial 53/2 and coronal 36/4). Small bowel in the right pelvis appears more decompressed although fluid and gas is visible in the terminal ileum. The appendix is normal. No gross colonic mass. No colonic wall thickening. Vascular/Lymphatic: There is moderate atherosclerotic calcification of the abdominal aorta without aneurysm. There is no gastrohepatic or hepatoduodenal ligament lymphadenopathy. No retroperitoneal or  mesenteric lymphadenopathy. Upper normal lymph nodes are seen in the small bowel mesentery of the pelvis. Reproductive: Calcified uterine fibroid evident. There is no adnexal mass. Other: Trace free fluid is seen in the pelvis. Musculoskeletal: No worrisome lytic or sclerotic osseous abnormality. IMPRESSION: 1. Distal small bowel in the pelvis is distended up to 2.7 cm diameter with fecalization of enteric contents suggesting decreased transit. Mesentery of the dilated bowel is congested/edematous. This most abnormal loop of small bowel tracks directly into a short segment of luminal narrowing secondary to circumferential wall thickening and enhancement along the left pelvic sidewall. Small bowel in the right pelvis appears more decompressed although fluid and gas is visible in the terminal ileum. Imaging features suggest small bowel obstruction potentially due to inflammatory stricture although neoplastic etiology is not excluded. Findings could also reflect mechanical obstruction due to adhesion with secondary wall thickening and hyperenhancement in the small bowel. 2. Trace free fluid in the pelvis. 3. Status post cholecystectomy with dilatation of the common bile duct up to about 11 mm diameter in the head of the pancreas with mild intrahepatic biliary duct prominence. Correlation with liver function tests recommended. 4. Calcified uterine fibroid. 5.  Aortic Atherosclerosis (ICD10-I70.0). Electronically Signed   By: Donnal Fusi M.D.   On: 07/26/2023 06:23    Impression:  53 y.o. year old female with history of anxiety, depression, asthma, TIA in 2012, chronic pain, hepatitis C s/p treatment with Epclusa achieving SVR, GERD, adenomatous colon polyps, nonspecific ileitis noted on colonoscopy in March 2025, who presented to the emergency room today with complaint of abdominal pain, nausea, vomiting, CT concerning for SBO  with possible stricture. Also with dilated biliary system s/p cholecystectomy.   SBO:   CT with small bowel obstruction possible due to inflammatory stricture vs mechanical obstruction due to adhesion with secondary wall  thickening and hyperenhancement in the small bowel, and unable to exclude neoplastic etiology. With known history of non-specific active on colonoscopy in March 2025 suspect this is more likely related to a chronic inflammatory stricture with differentials including Crohn's disease versus NSAID induced inflammatory stricture.  Notably, patient was taking ibuprofen  fairly frequently up until about 1 month ago.  She does report increasing intermittent generalized burning abdominal pain, but denies any chronic issues with her bowels.  2 days of diarrhea prior to her presentation to the ER, and now no bowel movement in the last 1.5 days.  Suspect that diarrhea was more likely related to overflow in the setting of small bowel stricture/development of SBO, but would consider stool studies if diarrhea returns.  At this time, will check CRP, start steroids for possible IBD, and treat supportively. No need for NG tube as she has no N/V with antiemetics. Will continue bowel rest today, but if she feels ok tomorrow and is passing more gas, could try starting her on clears.   If she doesn't start to improve in the next 72 hours, will need to get surgery involved.   GERD: Chronic.  Well-controlled on pantoprazole  40 mg daily.   Plan: IV methylprednisolone 60 mg daily. Levsin 0.125 mg SL every 6 hours as needed Judicious use of narcotic pain medication. Keep n.p.o for now.  Continue IV fluids while n.p.o. IV Zofran  4 mg every 6 hours as needed Continue daily pantoprazole  for chronic GERD. If no improvement in 72 hours, will need to get surgery involved.    LOS: 0 days    07/26/2023, 11:01 AM   Shana Daring, [facility]

## 2023-07-27 DIAGNOSIS — K219 Gastro-esophageal reflux disease without esophagitis: Secondary | ICD-10-CM | POA: Diagnosis not present

## 2023-07-27 DIAGNOSIS — B182 Chronic viral hepatitis C: Secondary | ICD-10-CM | POA: Diagnosis not present

## 2023-07-27 DIAGNOSIS — G894 Chronic pain syndrome: Secondary | ICD-10-CM | POA: Diagnosis not present

## 2023-07-27 DIAGNOSIS — K56609 Unspecified intestinal obstruction, unspecified as to partial versus complete obstruction: Secondary | ICD-10-CM | POA: Diagnosis not present

## 2023-07-27 DIAGNOSIS — K529 Noninfective gastroenteritis and colitis, unspecified: Secondary | ICD-10-CM

## 2023-07-27 LAB — COMPREHENSIVE METABOLIC PANEL WITH GFR
ALT: 11 U/L (ref 0–44)
AST: 14 U/L — ABNORMAL LOW (ref 15–41)
Albumin: 3.4 g/dL — ABNORMAL LOW (ref 3.5–5.0)
Alkaline Phosphatase: 72 U/L (ref 38–126)
Anion gap: 10 (ref 5–15)
BUN: 11 mg/dL (ref 6–20)
CO2: 25 mmol/L (ref 22–32)
Calcium: 9.2 mg/dL (ref 8.9–10.3)
Chloride: 104 mmol/L (ref 98–111)
Creatinine, Ser: 0.61 mg/dL (ref 0.44–1.00)
GFR, Estimated: >60 mL/min (ref >60)
Glucose, Bld: 99 mg/dL (ref 70–99)
Potassium: 3.9 mmol/L (ref 3.5–5.1)
Sodium: 139 mmol/L (ref 135–145)
Total Bilirubin: 0.5 mg/dL (ref 0.0–1.2)
Total Protein: 6.7 g/dL (ref 6.5–8.1)

## 2023-07-27 LAB — HEPATITIS B SURFACE ANTIGEN: Hepatitis B Surface Ag: NONREACTIVE

## 2023-07-27 LAB — CBC
HCT: 39.5 % (ref 36.0–46.0)
Hemoglobin: 13.5 g/dL (ref 12.0–15.0)
MCH: 29.2 pg (ref 26.0–34.0)
MCHC: 34.2 g/dL (ref 30.0–36.0)
MCV: 85.5 fL (ref 80.0–100.0)
Platelets: 189 10*3/uL (ref 150–400)
RBC: 4.62 MIL/uL (ref 3.87–5.11)
RDW: 13.2 % (ref 11.5–15.5)
WBC: 6.6 10*3/uL (ref 4.0–10.5)
nRBC: 0 % (ref 0.0–0.2)

## 2023-07-27 LAB — MAGNESIUM: Magnesium: 1.8 mg/dL (ref 1.7–2.4)

## 2023-07-27 NOTE — Progress Notes (Signed)
 PROGRESS NOTE   Brooke Woods  WUJ:[id number] DOB: 1971-01-16 DOA: 07/26/2023 PCP: Levada Raymond, PA   Chief Complaint  Patient presents with   Abdominal Pain   Level of care: Med-Surg  Brief Admission History:  53 y.o. female with medical history significant for chronic hepatitis C, GERD, fatty liver, and chronic pain who presented to the ED with epigastric abdominal pain along with nausea and vomiting that began yesterday evening, but worsened into earlier this morning.  She states that her last bowel movement was several days ago and she states that she has passing some flatus.  She denies any fevers or chills or radiation of the pain.  She states that her symptoms are consistent with when she had gallstones and required cholecystectomy many years ago.   ED Course: Vital signs stable and patient afebrile.  Labs with potassium 3.4.  CT scan with findings of SBO noted and some CBD dilation with prior evidence of cholecystectomy.  Patient has been given IV fluid bolus as well as Zofran  and Dilaudid in the ED.  She is currently complaining of some heartburn.   Assessment and Plan:  SBO - improving -Prior history of cholecystectomy and noted to have CBD dilation on CT scan -No LFT elevation noted - clears started on 6/18 after BM reported, advance diet as tolerated - IV fluids reduced as clears started - Zofran  as needed for nausea or vomiting - IV Dilaudid as needed for abdominal pain - Appreciate general surgery evaluation   Mild hypokalemia - repleted   GERD - IV PPI   History of chronic hepatitis C - Follows with GI outpatient - Treated with Epclusa with documented eradication 11/2021   Chronic pain - Multiple prior lower extremity surgeries and patient uses tramadol  at home - Manage with IV pain medications for now until able to tolerate oral    DVT prophylaxis: enoxaparin Code Status: Full  Family Communication:  Disposition:    Consultants:  Surgery GI   Procedures:   Antimicrobials:    Subjective: Pt reported having flatus overnight but no BM.   Objective: Vitals:   07/26/23 2010 07/26/23 2255 07/27/23 0516 07/27/23 1316  BP: (!) 141/88 (!) 130/91 (!) 174/83 (!) 145/97  Pulse: 85 87 (!) 54 (!) 59  Resp: 20 20 18    Temp: 98.4 F (36.9 C) 98.1 F (36.7 C) 98 F (36.7 C) 97.6 F (36.4 C)  TempSrc: Oral Oral Oral Oral  SpO2: 96% 97% 99% 98%  Weight:      Height:        Intake/Output Summary (Last 24 hours) at 07/27/2023 1456 Last data filed at 07/27/2023 1357 Gross per 24 hour  Intake 1647.79 ml  Output --  Net 1647.79 ml   Filed Weights   07/26/23 0329 07/26/23 1152  Weight: 52.2 kg 50.1 kg   Examination:  General exam: Appears calm and comfortable  Respiratory system: Clear to auscultation. Respiratory effort normal. Cardiovascular system: normal S1 & S2 heard. No JVD, murmurs, rubs, gallops or clicks. No pedal edema. Gastrointestinal system: Abdomen is nondistended, soft and nontender. No organomegaly or masses felt. Normal bowel sounds heard. Central nervous system: Alert and oriented. No focal neurological deficits. Extremities: Symmetric 5 x 5 power. Skin: No rashes, lesions or ulcers. Psychiatry: Judgement and insight appear normal. Mood & affect appropriate.   Data Reviewed: I have personally reviewed following labs and imaging studies  CBC: Recent Labs  Lab 07/26/23 0415 07/27/23 0459  WBC 10.4 6.6  NEUTROABS 7.9*  --  HGB 14.8 13.5  HCT 41.6 39.5  MCV 85.6 85.5  PLT 221 189    Basic Metabolic Panel: Recent Labs  Lab 07/26/23 0415 07/27/23 0459  NA 138 139  K 3.4* 3.9  CL 102 104  CO2 22 25  GLUCOSE 108* 99  BUN 16 11  CREATININE 0.69 0.61  CALCIUM 9.6 9.2  MG  --  1.8    CBG: No results for input(s): GLUCAP in the last 168 hours.  No results found for this or any previous visit (from the past 240 hours).   Radiology Studies: MR 3D Recon At Scanner Result Date:  07/26/2023 CLINICAL DATA:  Abdominal pain with nausea and vomiting along with chronic common bile duct dilatation. EXAM: MRI ABDOMEN WITHOUT AND WITH CONTRAST (INCLUDING MRCP) TECHNIQUE: Multiplanar multisequence MR imaging of the abdomen was performed both before and after the administration of intravenous contrast. Heavily T2-weighted images of the biliary and pancreatic ducts were obtained, and three-dimensional MRCP images were rendered by post processing. CONTRAST:  5.5mL GADAVIST  GADOBUTROL  1 MMOL/ML IV SOLN COMPARISON:  09/05/2020, and CT scan from 07/26/2023 FINDINGS: Despite efforts by the technologist and patient, motion artifact is present on today's exam and could not be eliminated. This reduces exam sensitivity and specificity. Lower chest: Unremarkable Hepatobiliary: Cholecystectomy. Common hepatic duct dilatation up to 1.1 cm with common bile duct dilatation up to 0.8 cm. Back on 01/06/2021 the common hepatic duct measured 1.8 cm and the common bile duct 0.9 cm. Accordingly this biliary dilatation is chronic. No filling defect is identified in the biliary tree, subject to limitations from motion artifact. This probably represents chronic physiologic dilatation of the extrahepatic biliary tree in response to cholecystectomy. No significant hepatic parenchymal lesion is identified. Pancreas:  Unremarkable Spleen:  Unremarkable Adrenals/Urinary Tract:  Unremarkable Stomach/Bowel: No dilated bowel along the upper abdomen. Small bowel loops of concern on prior CT scan more in the pelvis and not included on today's examination. Vascular/Lymphatic: Atherosclerosis is present, including aortoiliac atherosclerotic disease. Other:  No supplemental non-categorized findings. Musculoskeletal: Mild lumbar spondylosis and degenerative disc disease. IMPRESSION: 1. Chronic biliary dilatation up to 1.1 cm in the common hepatic duct and 0.8 cm in the common bile duct, without filling defect identified in the biliary  tree, subject to limitations from motion artifact. The degree of extrahepatic biliary dilatation is mildly reduced compared to 01/06/2021. This probably represents chronic physiologic dilatation of the extrahepatic biliary tree in response to cholecystectomy. 2. Aortic Atherosclerosis (ICD10-I70.0). 3. Mild lumbar spondylosis and degenerative disc disease. Electronically Signed   By: Freida Jes M.D.   On: 07/26/2023 13:52   MR ABDOMEN MRCP W WO CONTAST Result Date: 07/26/2023 CLINICAL DATA:  Abdominal pain with nausea and vomiting along with chronic common bile duct dilatation. EXAM: MRI ABDOMEN WITHOUT AND WITH CONTRAST (INCLUDING MRCP) TECHNIQUE: Multiplanar multisequence MR imaging of the abdomen was performed both before and after the administration of intravenous contrast. Heavily T2-weighted images of the biliary and pancreatic ducts were obtained, and three-dimensional MRCP images were rendered by post processing. CONTRAST:  5.5mL GADAVIST  GADOBUTROL  1 MMOL/ML IV SOLN COMPARISON:  09/05/2020, and CT scan from 07/26/2023 FINDINGS: Despite efforts by the technologist and patient, motion artifact is present on today's exam and could not be eliminated. This reduces exam sensitivity and specificity. Lower chest: Unremarkable Hepatobiliary: Cholecystectomy. Common hepatic duct dilatation up to 1.1 cm with common bile duct dilatation up to 0.8 cm. Back on 01/06/2021 the common hepatic duct measured 1.8 cm and the common  bile duct 0.9 cm. Accordingly this biliary dilatation is chronic. No filling defect is identified in the biliary tree, subject to limitations from motion artifact. This probably represents chronic physiologic dilatation of the extrahepatic biliary tree in response to cholecystectomy. No significant hepatic parenchymal lesion is identified. Pancreas:  Unremarkable Spleen:  Unremarkable Adrenals/Urinary Tract:  Unremarkable Stomach/Bowel: No dilated bowel along the upper abdomen. Small bowel  loops of concern on prior CT scan more in the pelvis and not included on today's examination. Vascular/Lymphatic: Atherosclerosis is present, including aortoiliac atherosclerotic disease. Other:  No supplemental non-categorized findings. Musculoskeletal: Mild lumbar spondylosis and degenerative disc disease. IMPRESSION: 1. Chronic biliary dilatation up to 1.1 cm in the common hepatic duct and 0.8 cm in the common bile duct, without filling defect identified in the biliary tree, subject to limitations from motion artifact. The degree of extrahepatic biliary dilatation is mildly reduced compared to 01/06/2021. This probably represents chronic physiologic dilatation of the extrahepatic biliary tree in response to cholecystectomy. 2. Aortic Atherosclerosis (ICD10-I70.0). 3. Mild lumbar spondylosis and degenerative disc disease. Electronically Signed   By: Freida Jes M.D.   On: 07/26/2023 13:52   CT ABDOMEN PELVIS W CONTRAST Result Date: 07/26/2023 CLINICAL DATA:  Nonlocalized abdominal pain with nausea vomiting. EXAM: CT ABDOMEN AND PELVIS WITH CONTRAST TECHNIQUE: Multidetector CT imaging of the abdomen and pelvis was performed using the standard protocol following bolus administration of intravenous contrast. RADIATION DOSE REDUCTION: This exam was performed according to the departmental dose-optimization program which includes automated exposure control, adjustment of the mA and/or kV according to patient size and/or use of iterative reconstruction technique. CONTRAST:  OMNIPAQUE IOHEXOL 300 MG/ML  SOLN COMPARISON:  07/22/2014 FINDINGS: Lower chest: No acute findings. Hepatobiliary: No suspicious focal abnormality within the liver parenchyma. Cholecystectomy. Common bile duct is dilated up to about 11 mm diameter in the head of the pancreas with mild intrahepatic biliary duct prominence. Pancreas: No focal mass lesion. No dilatation of the main duct. No intraparenchymal cyst. No peripancreatic edema.  Spleen: No splenomegaly. No suspicious focal mass lesion. Adrenals/Urinary Tract: No adrenal nodule or mass. Kidneys unremarkable. No evidence for hydroureter. The urinary bladder appears normal for the degree of distention. Stomach/Bowel: Stomach is unremarkable. No gastric wall thickening. No evidence of outlet obstruction. Duodenum is normally positioned as is the ligament of Treitz. Duodenal diverticulum noted. No small bowel wall thickening. Proximal jejunal loops are nondilated. More distal small bowel in the pelvis is distended up to 2.7 cm diameter with fecalization of enteric contents suggesting decreased transit. Mesentery of the dilated bowel is congested/edematous. This most abnormal loop of small bowel tracks directly into a short segment of circumferential wall thickening and enhancement along the left pelvic sidewall (see axial 53/2 and coronal 36/4). Small bowel in the right pelvis appears more decompressed although fluid and gas is visible in the terminal ileum. The appendix is normal. No gross colonic mass. No colonic wall thickening. Vascular/Lymphatic: There is moderate atherosclerotic calcification of the abdominal aorta without aneurysm. There is no gastrohepatic or hepatoduodenal ligament lymphadenopathy. No retroperitoneal or mesenteric lymphadenopathy. Upper normal lymph nodes are seen in the small bowel mesentery of the pelvis. Reproductive: Calcified uterine fibroid evident. There is no adnexal mass. Other: Trace free fluid is seen in the pelvis. Musculoskeletal: No worrisome lytic or sclerotic osseous abnormality. IMPRESSION: 1. Distal small bowel in the pelvis is distended up to 2.7 cm diameter with fecalization of enteric contents suggesting decreased transit. Mesentery of the dilated bowel is congested/edematous. This  most abnormal loop of small bowel tracks directly into a short segment of luminal narrowing secondary to circumferential wall thickening and enhancement along the left  pelvic sidewall. Small bowel in the right pelvis appears more decompressed although fluid and gas is visible in the terminal ileum. Imaging features suggest small bowel obstruction potentially due to inflammatory stricture although neoplastic etiology is not excluded. Findings could also reflect mechanical obstruction due to adhesion with secondary wall thickening and hyperenhancement in the small bowel. 2. Trace free fluid in the pelvis. 3. Status post cholecystectomy with dilatation of the common bile duct up to about 11 mm diameter in the head of the pancreas with mild intrahepatic biliary duct prominence. Correlation with liver function tests recommended. 4. Calcified uterine fibroid. 5.  Aortic Atherosclerosis (ICD10-I70.0). Electronically Signed   By: Donnal Fusi M.D.   On: 07/26/2023 06:23    Scheduled Meds:  enoxaparin (LOVENOX) injection  40 mg Subcutaneous Q24H   methylPREDNISolone (SOLU-MEDROL) injection  60 mg Intravenous Daily   nicotine  7 mg Transdermal Daily   pantoprazole  (PROTONIX ) IV  40 mg Intravenous Q24H   Continuous Infusions:  lactated ringers  75 mL/hr at 07/27/23 1357     LOS: 1 day   Time spent: 55 mins  Caiden Monsivais Lincoln Renshaw, MD How to contact the [facility] Attending or Consulting provider 7A - 7P or covering provider during after hours 7P -7A, for this patient?  Check the care team in [facility] and look for a) attending/consulting [facility] provider listed and b) the [facility] team listed Log into [email or web address] to find provider on call.  Locate the [facility] provider you are looking for under [facility] and page to a number that you can be directly reached. If you still have difficulty reaching the provider, please page the [facility] (Director on Call) for the Hospitalists listed on amion for assistance.  07/27/2023, 2:56 PM

## 2023-07-27 NOTE — Progress Notes (Signed)
   07/27/23 0731  TOC Brief Assessment  Insurance and Status Reviewed  Patient has primary care physician Yes  Home environment has been reviewed Home  Prior level of function: Independent  Prior/Current Home Services No current home services  Social Drivers of Health Review SDOH reviewed no interventions necessary  Readmission risk has been reviewed Yes  Transition of care needs no transition of care needs at this time   Transition of Care Department [facility]) has reviewed patient and no TOC needs have been identified at this time. We will continue to monitor patient advancement through interdisciplinary progression rounds. If new patient transition needs arise, please place a TOC consult.

## 2023-07-27 NOTE — Plan of Care (Signed)

## 2023-07-27 NOTE — Progress Notes (Signed)
 Subjective: Patient is just having occasional crampy abdominal pain.  No nausea or vomiting noted.  She is hungry.  She has not had a bowel movement yet.  Objective: Vital signs in last 24 hours: Temp:  [98 F (36.7 C)-98.4 F (36.9 C)] 98 F (36.7 C) (06/18 0516) Pulse Rate:  [54-87] 54 (06/18 0516) Resp:  [14-20] 18 (06/18 0516) BP: (130-176)/(83-91) 174/83 (06/18 0516) SpO2:  [96 %-99 %] 99 % (06/18 0516) Weight:  [50.1 kg] 50.1 kg (06/17 1152) Last BM Date : 07/24/23  Intake/Output from previous day: 06/17 0701 - 06/18 0700 In: 280.9 [I.V.:194.8; IV Piggyback:86.1] Out: -  Intake/Output this shift: No intake/output data recorded.  General appearance: alert, cooperative, and no distress GI: Soft with minimal tenderness noted.  No distention noted.  No point tenderness noted.  No rigidity noted.  Lab Results:  Recent Labs    07/26/23 0415 07/27/23 0459  WBC 10.4 6.6  HGB 14.8 13.5  HCT 41.6 39.5  PLT 221 189   BMET Recent Labs    07/26/23 0415 07/27/23 0459  NA 138 139  K 3.4* 3.9  CL 102 104  CO2 22 25  GLUCOSE 108* 99  BUN 16 11  CREATININE 0.69 0.61  CALCIUM 9.6 9.2   PT/INR No results for input(s): LABPROT, INR in the last 72 hours.  Studies/Results: MR 3D Recon At Scanner Result Date: 07/26/2023 CLINICAL DATA:  Abdominal pain with nausea and vomiting along with chronic common bile duct dilatation. EXAM: MRI ABDOMEN WITHOUT AND WITH CONTRAST (INCLUDING MRCP) TECHNIQUE: Multiplanar multisequence MR imaging of the abdomen was performed both before and after the administration of intravenous contrast. Heavily T2-weighted images of the biliary and pancreatic ducts were obtained, and three-dimensional MRCP images were rendered by post processing. CONTRAST:  5.5mL GADAVIST  GADOBUTROL  1 MMOL/ML IV SOLN COMPARISON:  09/05/2020, and CT scan from 07/26/2023 FINDINGS: Despite efforts by the technologist and patient, motion artifact is present on today's exam  and could not be eliminated. This reduces exam sensitivity and specificity. Lower chest: Unremarkable Hepatobiliary: Cholecystectomy. Common hepatic duct dilatation up to 1.1 cm with common bile duct dilatation up to 0.8 cm. Back on 01/06/2021 the common hepatic duct measured 1.8 cm and the common bile duct 0.9 cm. Accordingly this biliary dilatation is chronic. No filling defect is identified in the biliary tree, subject to limitations from motion artifact. This probably represents chronic physiologic dilatation of the extrahepatic biliary tree in response to cholecystectomy. No significant hepatic parenchymal lesion is identified. Pancreas:  Unremarkable Spleen:  Unremarkable Adrenals/Urinary Tract:  Unremarkable Stomach/Bowel: No dilated bowel along the upper abdomen. Small bowel loops of concern on prior CT scan more in the pelvis and not included on today's examination. Vascular/Lymphatic: Atherosclerosis is present, including aortoiliac atherosclerotic disease. Other:  No supplemental non-categorized findings. Musculoskeletal: Mild lumbar spondylosis and degenerative disc disease. IMPRESSION: 1. Chronic biliary dilatation up to 1.1 cm in the common hepatic duct and 0.8 cm in the common bile duct, without filling defect identified in the biliary tree, subject to limitations from motion artifact. The degree of extrahepatic biliary dilatation is mildly reduced compared to 01/06/2021. This probably represents chronic physiologic dilatation of the extrahepatic biliary tree in response to cholecystectomy. 2. Aortic Atherosclerosis (ICD10-I70.0). 3. Mild lumbar spondylosis and degenerative disc disease. Electronically Signed   By: Freida Jes M.D.   On: 07/26/2023 13:52   MR ABDOMEN MRCP W WO CONTAST Result Date: 07/26/2023 CLINICAL DATA:  Abdominal pain with nausea and vomiting along with  chronic common bile duct dilatation. EXAM: MRI ABDOMEN WITHOUT AND WITH CONTRAST (INCLUDING MRCP) TECHNIQUE:  Multiplanar multisequence MR imaging of the abdomen was performed both before and after the administration of intravenous contrast. Heavily T2-weighted images of the biliary and pancreatic ducts were obtained, and three-dimensional MRCP images were rendered by post processing. CONTRAST:  5.5mL GADAVIST  GADOBUTROL  1 MMOL/ML IV SOLN COMPARISON:  09/05/2020, and CT scan from 07/26/2023 FINDINGS: Despite efforts by the technologist and patient, motion artifact is present on today's exam and could not be eliminated. This reduces exam sensitivity and specificity. Lower chest: Unremarkable Hepatobiliary: Cholecystectomy. Common hepatic duct dilatation up to 1.1 cm with common bile duct dilatation up to 0.8 cm. Back on 01/06/2021 the common hepatic duct measured 1.8 cm and the common bile duct 0.9 cm. Accordingly this biliary dilatation is chronic. No filling defect is identified in the biliary tree, subject to limitations from motion artifact. This probably represents chronic physiologic dilatation of the extrahepatic biliary tree in response to cholecystectomy. No significant hepatic parenchymal lesion is identified. Pancreas:  Unremarkable Spleen:  Unremarkable Adrenals/Urinary Tract:  Unremarkable Stomach/Bowel: No dilated bowel along the upper abdomen. Small bowel loops of concern on prior CT scan more in the pelvis and not included on today's examination. Vascular/Lymphatic: Atherosclerosis is present, including aortoiliac atherosclerotic disease. Other:  No supplemental non-categorized findings. Musculoskeletal: Mild lumbar spondylosis and degenerative disc disease. IMPRESSION: 1. Chronic biliary dilatation up to 1.1 cm in the common hepatic duct and 0.8 cm in the common bile duct, without filling defect identified in the biliary tree, subject to limitations from motion artifact. The degree of extrahepatic biliary dilatation is mildly reduced compared to 01/06/2021. This probably represents chronic physiologic  dilatation of the extrahepatic biliary tree in response to cholecystectomy. 2. Aortic Atherosclerosis (ICD10-I70.0). 3. Mild lumbar spondylosis and degenerative disc disease. Electronically Signed   By: Freida Jes M.D.   On: 07/26/2023 13:52   CT ABDOMEN PELVIS W CONTRAST Result Date: 07/26/2023 CLINICAL DATA:  Nonlocalized abdominal pain with nausea vomiting. EXAM: CT ABDOMEN AND PELVIS WITH CONTRAST TECHNIQUE: Multidetector CT imaging of the abdomen and pelvis was performed using the standard protocol following bolus administration of intravenous contrast. RADIATION DOSE REDUCTION: This exam was performed according to the departmental dose-optimization program which includes automated exposure control, adjustment of the mA and/or kV according to patient size and/or use of iterative reconstruction technique. CONTRAST:  OMNIPAQUE IOHEXOL 300 MG/ML  SOLN COMPARISON:  07/22/2014 FINDINGS: Lower chest: No acute findings. Hepatobiliary: No suspicious focal abnormality within the liver parenchyma. Cholecystectomy. Common bile duct is dilated up to about 11 mm diameter in the head of the pancreas with mild intrahepatic biliary duct prominence. Pancreas: No focal mass lesion. No dilatation of the main duct. No intraparenchymal cyst. No peripancreatic edema. Spleen: No splenomegaly. No suspicious focal mass lesion. Adrenals/Urinary Tract: No adrenal nodule or mass. Kidneys unremarkable. No evidence for hydroureter. The urinary bladder appears normal for the degree of distention. Stomach/Bowel: Stomach is unremarkable. No gastric wall thickening. No evidence of outlet obstruction. Duodenum is normally positioned as is the ligament of Treitz. Duodenal diverticulum noted. No small bowel wall thickening. Proximal jejunal loops are nondilated. More distal small bowel in the pelvis is distended up to 2.7 cm diameter with fecalization of enteric contents suggesting decreased transit. Mesentery of the dilated bowel  is congested/edematous. This most abnormal loop of small bowel tracks directly into a short segment of circumferential wall thickening and enhancement along the left pelvic sidewall (see axial  53/2 and coronal 36/4). Small bowel in the right pelvis appears more decompressed although fluid and gas is visible in the terminal ileum. The appendix is normal. No gross colonic mass. No colonic wall thickening. Vascular/Lymphatic: There is moderate atherosclerotic calcification of the abdominal aorta without aneurysm. There is no gastrohepatic or hepatoduodenal ligament lymphadenopathy. No retroperitoneal or mesenteric lymphadenopathy. Upper normal lymph nodes are seen in the small bowel mesentery of the pelvis. Reproductive: Calcified uterine fibroid evident. There is no adnexal mass. Other: Trace free fluid is seen in the pelvis. Musculoskeletal: No worrisome lytic or sclerotic osseous abnormality. IMPRESSION: 1. Distal small bowel in the pelvis is distended up to 2.7 cm diameter with fecalization of enteric contents suggesting decreased transit. Mesentery of the dilated bowel is congested/edematous. This most abnormal loop of small bowel tracks directly into a short segment of luminal narrowing secondary to circumferential wall thickening and enhancement along the left pelvic sidewall. Small bowel in the right pelvis appears more decompressed although fluid and gas is visible in the terminal ileum. Imaging features suggest small bowel obstruction potentially due to inflammatory stricture although neoplastic etiology is not excluded. Findings could also reflect mechanical obstruction due to adhesion with secondary wall thickening and hyperenhancement in the small bowel. 2. Trace free fluid in the pelvis. 3. Status post cholecystectomy with dilatation of the common bile duct up to about 11 mm diameter in the head of the pancreas with mild intrahepatic biliary duct prominence. Correlation with liver function tests  recommended. 4. Calcified uterine fibroid. 5.  Aortic Atherosclerosis (ICD10-I70.0). Electronically Signed   By: Donnal Fusi M.D.   On: 07/26/2023 06:23    Anti-infectives: Anti-infectives (From admission, onward)    None       Assessment/Plan: Impression: Partial small bowel obstruction most likely secondary to enteritis.  Appreciate GI input.  No need for surgical intervention at the present time.  Will try to treat this medically.  Will start sips of clears.  LOS: 1 day    Brooke Woods 07/27/2023

## 2023-07-27 NOTE — Progress Notes (Signed)
 Subjective: Feeling somewhat better today. No burning in stomach but drank some coffee and has had some lower abdominal cramping since. No nausea or vomiting, but receiving nausea meds. She was passing some flatus overnight, no BMs so far. She states she is nervous to try and have a BM and was not sure if this was safe due to concern for bowel rupture.   Objective: Vital signs in last 24 hours: Temp:  [98 F (36.7 C)-98.4 F (36.9 C)] 98 F (36.7 C) (06/18 0516) Pulse Rate:  [49-87] 54 (06/18 0516) Resp:  [14-20] 18 (06/18 0516) BP: (130-187)/(83-99) 174/83 (06/18 0516) SpO2:  [96 %-99 %] 99 % (06/18 0516) Weight:  [50.1 kg] 50.1 kg (06/17 1152) Last BM Date : 07/24/23 General:   Alert and oriented, pleasant Head:  Normocephalic and atraumatic. Eyes:  No icterus, sclera clear. Conjuctiva pink.  Mouth:  Without lesions, mucosa pink and moist.  Heart:  S1, S2 present, no murmurs noted.  Lungs: Clear to auscultation bilaterally, without wheezing, rales, or rhonchi.  Abdomen:  Bowel sounds present, soft, TTP of lower abdomen,  non-distended. No HSM or hernias noted. No rebound or guarding. No masses appreciated  Msk:  Symmetrical without gross deformities. Normal posture. Neurologic:  Alert and  oriented x4;  grossly normal neurologically. Psych:  Alert and cooperative. Normal mood and affect.  Intake/Output from previous day: 06/17 0701 - 06/18 0700 In: 280.9 [I.V.:194.8; IV Piggyback:86.1] Out: -  Intake/Output this shift: No intake/output data recorded.  Lab Results: Recent Labs    07/26/23 0415 07/27/23 0459  WBC 10.4 6.6  HGB 14.8 13.5  HCT 41.6 39.5  PLT 221 189   BMET Recent Labs    07/26/23 0415 07/27/23 0459  NA 138 139  K 3.4* 3.9  CL 102 104  CO2 22 25  GLUCOSE 108* 99  BUN 16 11  CREATININE 0.69 0.61  CALCIUM 9.6 9.2   LFT Recent Labs    07/26/23 0415 07/27/23 0459  PROT 7.8 6.7  ALBUMIN 4.0 3.4*  AST 17 14*  ALT 13 11  ALKPHOS 86 72   BILITOT 1.0 0.5   Studies/Results: MR 3D Recon At Scanner Result Date: 07/26/2023 CLINICAL DATA:  Abdominal pain with nausea and vomiting along with chronic common bile duct dilatation. EXAM: MRI ABDOMEN WITHOUT AND WITH CONTRAST (INCLUDING MRCP) TECHNIQUE: Multiplanar multisequence MR imaging of the abdomen was performed both before and after the administration of intravenous contrast. Heavily T2-weighted images of the biliary and pancreatic ducts were obtained, and three-dimensional MRCP images were rendered by post processing. CONTRAST:  5.5mL GADAVIST  GADOBUTROL  1 MMOL/ML IV SOLN COMPARISON:  09/05/2020, and CT scan from 07/26/2023 FINDINGS: Despite efforts by the technologist and patient, motion artifact is present on today's exam and could not be eliminated. This reduces exam sensitivity and specificity. Lower chest: Unremarkable Hepatobiliary: Cholecystectomy. Common hepatic duct dilatation up to 1.1 cm with common bile duct dilatation up to 0.8 cm. Back on 01/06/2021 the common hepatic duct measured 1.8 cm and the common bile duct 0.9 cm. Accordingly this biliary dilatation is chronic. No filling defect is identified in the biliary tree, subject to limitations from motion artifact. This probably represents chronic physiologic dilatation of the extrahepatic biliary tree in response to cholecystectomy. No significant hepatic parenchymal lesion is identified. Pancreas:  Unremarkable Spleen:  Unremarkable Adrenals/Urinary Tract:  Unremarkable Stomach/Bowel: No dilated bowel along the upper abdomen. Small bowel loops of concern on prior CT scan more in the pelvis and not included on  today's examination. Vascular/Lymphatic: Atherosclerosis is present, including aortoiliac atherosclerotic disease. Other:  No supplemental non-categorized findings. Musculoskeletal: Mild lumbar spondylosis and degenerative disc disease. IMPRESSION: 1. Chronic biliary dilatation up to 1.1 cm in the common hepatic duct and 0.8 cm  in the common bile duct, without filling defect identified in the biliary tree, subject to limitations from motion artifact. The degree of extrahepatic biliary dilatation is mildly reduced compared to 01/06/2021. This probably represents chronic physiologic dilatation of the extrahepatic biliary tree in response to cholecystectomy. 2. Aortic Atherosclerosis (ICD10-I70.0). 3. Mild lumbar spondylosis and degenerative disc disease. Electronically Signed   By: Freida Jes M.D.   On: 07/26/2023 13:52   MR ABDOMEN MRCP W WO CONTAST Result Date: 07/26/2023 CLINICAL DATA:  Abdominal pain with nausea and vomiting along with chronic common bile duct dilatation. EXAM: MRI ABDOMEN WITHOUT AND WITH CONTRAST (INCLUDING MRCP) TECHNIQUE: Multiplanar multisequence MR imaging of the abdomen was performed both before and after the administration of intravenous contrast. Heavily T2-weighted images of the biliary and pancreatic ducts were obtained, and three-dimensional MRCP images were rendered by post processing. CONTRAST:  5.5mL GADAVIST  GADOBUTROL  1 MMOL/ML IV SOLN COMPARISON:  09/05/2020, and CT scan from 07/26/2023 FINDINGS: Despite efforts by the technologist and patient, motion artifact is present on today's exam and could not be eliminated. This reduces exam sensitivity and specificity. Lower chest: Unremarkable Hepatobiliary: Cholecystectomy. Common hepatic duct dilatation up to 1.1 cm with common bile duct dilatation up to 0.8 cm. Back on 01/06/2021 the common hepatic duct measured 1.8 cm and the common bile duct 0.9 cm. Accordingly this biliary dilatation is chronic. No filling defect is identified in the biliary tree, subject to limitations from motion artifact. This probably represents chronic physiologic dilatation of the extrahepatic biliary tree in response to cholecystectomy. No significant hepatic parenchymal lesion is identified. Pancreas:  Unremarkable Spleen:  Unremarkable Adrenals/Urinary Tract:   Unremarkable Stomach/Bowel: No dilated bowel along the upper abdomen. Small bowel loops of concern on prior CT scan more in the pelvis and not included on today's examination. Vascular/Lymphatic: Atherosclerosis is present, including aortoiliac atherosclerotic disease. Other:  No supplemental non-categorized findings. Musculoskeletal: Mild lumbar spondylosis and degenerative disc disease. IMPRESSION: 1. Chronic biliary dilatation up to 1.1 cm in the common hepatic duct and 0.8 cm in the common bile duct, without filling defect identified in the biliary tree, subject to limitations from motion artifact. The degree of extrahepatic biliary dilatation is mildly reduced compared to 01/06/2021. This probably represents chronic physiologic dilatation of the extrahepatic biliary tree in response to cholecystectomy. 2. Aortic Atherosclerosis (ICD10-I70.0). 3. Mild lumbar spondylosis and degenerative disc disease. Electronically Signed   By: Freida Jes M.D.   On: 07/26/2023 13:52   CT ABDOMEN PELVIS W CONTRAST Result Date: 07/26/2023 CLINICAL DATA:  Nonlocalized abdominal pain with nausea vomiting. EXAM: CT ABDOMEN AND PELVIS WITH CONTRAST TECHNIQUE: Multidetector CT imaging of the abdomen and pelvis was performed using the standard protocol following bolus administration of intravenous contrast. RADIATION DOSE REDUCTION: This exam was performed according to the departmental dose-optimization program which includes automated exposure control, adjustment of the mA and/or kV according to patient size and/or use of iterative reconstruction technique. CONTRAST:  OMNIPAQUE IOHEXOL 300 MG/ML  SOLN COMPARISON:  07/22/2014 FINDINGS: Lower chest: No acute findings. Hepatobiliary: No suspicious focal abnormality within the liver parenchyma. Cholecystectomy. Common bile duct is dilated up to about 11 mm diameter in the head of the pancreas with mild intrahepatic biliary duct prominence. Pancreas: No focal mass lesion.  No dilatation of the main duct. No intraparenchymal cyst. No peripancreatic edema. Spleen: No splenomegaly. No suspicious focal mass lesion. Adrenals/Urinary Tract: No adrenal nodule or mass. Kidneys unremarkable. No evidence for hydroureter. The urinary bladder appears normal for the degree of distention. Stomach/Bowel: Stomach is unremarkable. No gastric wall thickening. No evidence of outlet obstruction. Duodenum is normally positioned as is the ligament of Treitz. Duodenal diverticulum noted. No small bowel wall thickening. Proximal jejunal loops are nondilated. More distal small bowel in the pelvis is distended up to 2.7 cm diameter with fecalization of enteric contents suggesting decreased transit. Mesentery of the dilated bowel is congested/edematous. This most abnormal loop of small bowel tracks directly into a short segment of circumferential wall thickening and enhancement along the left pelvic sidewall (see axial 53/2 and coronal 36/4). Small bowel in the right pelvis appears more decompressed although fluid and gas is visible in the terminal ileum. The appendix is normal. No gross colonic mass. No colonic wall thickening. Vascular/Lymphatic: There is moderate atherosclerotic calcification of the abdominal aorta without aneurysm. There is no gastrohepatic or hepatoduodenal ligament lymphadenopathy. No retroperitoneal or mesenteric lymphadenopathy. Upper normal lymph nodes are seen in the small bowel mesentery of the pelvis. Reproductive: Calcified uterine fibroid evident. There is no adnexal mass. Other: Trace free fluid is seen in the pelvis. Musculoskeletal: No worrisome lytic or sclerotic osseous abnormality. IMPRESSION: 1. Distal small bowel in the pelvis is distended up to 2.7 cm diameter with fecalization of enteric contents suggesting decreased transit. Mesentery of the dilated bowel is congested/edematous. This most abnormal loop of small bowel tracks directly into a short segment of luminal  narrowing secondary to circumferential wall thickening and enhancement along the left pelvic sidewall. Small bowel in the right pelvis appears more decompressed although fluid and gas is visible in the terminal ileum. Imaging features suggest small bowel obstruction potentially due to inflammatory stricture although neoplastic etiology is not excluded. Findings could also reflect mechanical obstruction due to adhesion with secondary wall thickening and hyperenhancement in the small bowel. 2. Trace free fluid in the pelvis. 3. Status post cholecystectomy with dilatation of the common bile duct up to about 11 mm diameter in the head of the pancreas with mild intrahepatic biliary duct prominence. Correlation with liver function tests recommended. 4. Calcified uterine fibroid. 5.  Aortic Atherosclerosis (ICD10-I70.0). Electronically Signed   By: Donnal Fusi M.D.   On: 07/26/2023 06:23   Assessment: 53 year old female with history of anxiety, depression, asthma, TIA in 2012, chronic pain, hepatitis C s/p treatment with Epclusa achieving SVR, GERD, adenomatous colon polyps, nonspecific ileitis noted on colonoscopy in March 2025, who presented to the ED tuesday with complaints of abdominal pain, nausea, vomiting, CT concerning for SBO  with possible stricture. Also with dilated biliary system s/p cholecystectomy.   SBO: -CT with small bowel obstruction possible due to inflammatory stricture vs mechanical obstruction due to adhesion with secondary wall thickening and hyperenhancement in the small bowel, and unable to exclude neoplastic etiology  - known history of non-specific active ileitis/2 ulcers in ileum on colonoscopy in March 2025 suspect this is more likely related to a chronic inflammatory stricture with differentials including Crohn's disease versus NSAID induced inflammatory stricture(taking ibuprofen  frequently until 1 month ago) -reported 2 days of diarrhea prior to admission, though no BMs since,  likely secondary to overflow in setting of SBO/stricture -stools studies if diarrhea recurs -no NG tube indicated given no n/v -bowel sounds good, passing flatus, can transition to clear  liquids today -no BMs today so far, patient reassured okay to try to have a BM if she feels the urge -IV methylprednisolone 60mg  daily initiated on 6/17 -CRP 0.6  -Hep Bs Ag and TB quant pending ( in preparation for possible IBD therapy) -General surgery on board, no acute surgical intervention warranted at this time, suspect possible enteritis/adhesions as differentials  CBD dilation:  -up to 11mm in head of pancreas with mild intrahepatic biliary duct prominence on CT -MRCP Chronic biliary dilatation up to 1.1 cm in the common hepatic duct and 0.8 cm in the common bile duct, without filling defect identified in the biliary tree, subject to limitations from motion artifact. degree of extrahepatic biliary dilatation mildly reduced compared to 01/06/2021. likely represents chronic physiologic dilatation of the extrahepatic biliary tree in response to cholecystectomy.  GERD:  Chronic, well controlled on protonix  40mg  daily   Plan: Continue IV methyprednisolone 60mg  daily Levsin 0.125mg  SL q6h PRN Minimize opiate pain meds as much as possible IV zofran  4mg  Q6h PRN Daily PPI Smoking cessation Avoid all NSAIDs Clear liquids today    LOS: 1 day    07/27/2023, 9:19 AM   Delisia Mcquiston L. Fay Bagg, MSN, APRN, AGNP-C Adult-Gerontology Nurse Practitioner [facility] at [facility]

## 2023-07-27 NOTE — Hospital Course (Signed)
 53 y.o. female with medical history significant for chronic hepatitis C, GERD, fatty liver, and chronic pain who presented to the ED with epigastric abdominal pain along with nausea and vomiting that began yesterday evening, but worsened into earlier this morning.  She states that her last bowel movement was several days ago and she states that she has passing some flatus.  She denies any fevers or chills or radiation of the pain.  She states that her symptoms are consistent with when she had gallstones and required cholecystectomy many years ago.   ED Course: Vital signs stable and patient afebrile.  Labs with potassium 3.4.  CT scan with findings of SBO noted and some CBD dilation with prior evidence of cholecystectomy.  Patient has been given IV fluid bolus as well as Zofran  and Dilaudid in the ED.  She is currently complaining of some heartburn.

## 2023-07-28 ENCOUNTER — Telehealth: Payer: Self-pay | Admitting: Gastroenterology

## 2023-07-28 ENCOUNTER — Other Ambulatory Visit: Payer: Self-pay

## 2023-07-28 DIAGNOSIS — K529 Noninfective gastroenteritis and colitis, unspecified: Secondary | ICD-10-CM

## 2023-07-28 DIAGNOSIS — B182 Chronic viral hepatitis C: Secondary | ICD-10-CM | POA: Diagnosis not present

## 2023-07-28 DIAGNOSIS — K56609 Unspecified intestinal obstruction, unspecified as to partial versus complete obstruction: Secondary | ICD-10-CM | POA: Diagnosis not present

## 2023-07-28 DIAGNOSIS — K219 Gastro-esophageal reflux disease without esophagitis: Secondary | ICD-10-CM | POA: Diagnosis not present

## 2023-07-28 MED ORDER — NICOTINE 21 MG/24HR TD PT24: 21.0000 mg | MEDICATED_PATCH | TRANSDERMAL | 0 refills | Status: AC

## 2023-07-28 MED ORDER — PREDNISONE 10 MG PO TABS: ORAL_TABLET | ORAL | 0 refills | Status: AC

## 2023-07-28 MED ORDER — TRAMADOL HCL 50 MG PO TABS: 50.0000 mg | ORAL_TABLET | Freq: Two times a day (BID) | ORAL | 0 refills | Status: DC | PRN

## 2023-07-28 MED ORDER — PREDNISONE 10 MG PO TABS: ORAL_TABLET | ORAL | 0 refills | Status: DC

## 2023-07-28 NOTE — Telephone Encounter (Signed)
 Patient being discharged from hospital today. She will need ov with Marlea Silvers, or Dr. Mordechai April in 2-3 weeks. This timing is crucial because she will need to be seen prior to running out of prednisone  so that we can determine remainder of taper.

## 2023-07-28 NOTE — Discharge Instructions (Signed)
IMPORTANT INFORMATION: PAY CLOSE ATTENTION   PHYSICIAN DISCHARGE INSTRUCTIONS  Follow with Primary care provider  Worley, Miranda, PA  and other consultants as instructed by your Hospitalist Physician  SEEK MEDICAL CARE OR RETURN TO EMERGENCY ROOM IF SYMPTOMS COME BACK, WORSEN OR NEW PROBLEM DEVELOPS   Please note: You were cared for by a hospitalist during your hospital stay. Every effort will be made to forward records to your primary care provider.  You can request that your primary care provider send for your hospital records if they have not received them.  Once you are discharged, your primary care physician will handle any further medical issues. Please note that NO REFILLS for any discharge medications will be authorized once you are discharged, as it is imperative that you return to your primary care physician (or establish a relationship with a primary care physician if you do not have one) for your post hospital discharge needs so that they can reassess your need for medications and monitor your lab values.  Please get a complete blood count and chemistry panel checked by your Primary MD at your next visit, and again as instructed by your Primary MD.  Get Medicines reviewed and adjusted: Please take all your medications with you for your next visit with your Primary MD  Laboratory/radiological data: Please request your Primary MD to go over all hospital tests and procedure/radiological results at the follow up, please ask your primary care provider to get all Hospital records sent to his/her office.  In some cases, they will be blood work, cultures and biopsy results pending at the time of your discharge. Please request that your primary care provider follow up on these results.  If you are diabetic, please bring your blood sugar readings with you to your follow up appointment with primary care.    Please call and make your follow up appointments as soon as possible.    Also Note  the following: If you experience worsening of your admission symptoms, develop shortness of breath, life threatening emergency, suicidal or homicidal thoughts you must seek medical attention immediately by calling 911 or calling your MD immediately  if symptoms less severe.  You must read complete instructions/literature along with all the possible adverse reactions/side effects for all the Medicines you take and that have been prescribed to you. Take any new Medicines after you have completely understood and accpet all the possible adverse reactions/side effects.   Do not drive when taking Pain medications or sleeping medications (Benzodiazepines)  Do not take more than prescribed Pain, Sleep and Anxiety Medications. It is not advisable to combine anxiety,sleep and pain medications without talking with your primary care practitioner  Special Instructions: If you have smoked or chewed Tobacco  in the last 2 yrs please stop smoking, stop any regular Alcohol  and or any Recreational drug use.  Wear Seat belts while driving.  Do not drive if taking any narcotic, mind altering or controlled substances or recreational drugs or alcohol.       

## 2023-07-28 NOTE — Discharge Summary (Addendum)
 Physician Discharge Summary  Brooke Woods OZD:[id number] DOB: 1970/03/26 DOA: 07/26/2023  PCP: Levada Raymond, PA GI: [facility] Mordechai April)  Admit date: 07/26/2023 Discharge date: 07/28/2023  Admitted From:  Home  Disposition: Home   Recommendations for Outpatient Follow-up:  Follow up with PCP in 1 weeks Follow up with [facility] in 3 weeks   Discharge Condition: STABLE   CODE STATUS: FULL DIET: soft foods, advance diet as tolerated    Brief Hospitalization Summary: Please see all hospital notes, images, labs for full details of the hospitalization. Admission provider HPI:  53 y.o. female with medical history significant for chronic hepatitis C, GERD, fatty liver, and chronic pain who presented to the ED with epigastric abdominal pain along with nausea and vomiting that began yesterday evening, but worsened into earlier this morning.  She states that her last bowel movement was several days ago and she states that she has passing some flatus.  She denies any fevers or chills or radiation of the pain.  She states that her symptoms are consistent with when she had gallstones and required cholecystectomy many years ago.   ED Course: Vital signs stable and patient afebrile.  Labs with potassium 3.4.  CT scan with findings of SBO noted and some CBD dilation with prior evidence of cholecystectomy.  Patient has been given IV fluid bolus as well as Zofran  and Dilaudid in the ED.  She is currently complaining of some heartburn.  Hospital Course by problem list  SBO - RESOLVED  - Prior history of cholecystectomy and noted to have CBD dilation on CT scan - No LFT elevation  - clears started on 6/18 after BM reported, advanced to soft diet and had a bowel movement - discussed with surgery and GI and both agreeable to DC home today, outpatient appt with [facility] arranged - Appreciate general surgery and GI evaluation - GI recommended I discharge home on oral prednisone  taper:  40 mg x 1  week, 30 mg x 1 week, 20 mg x 2 weeks, they have made arrangements for her to follow up with [facility].     Mild hypokalemia - repleted   GERD - resume home PPI   History of chronic hepatitis C - Follows with GI outpatient - Treated with Epclusa with documented eradication 11/2021   Chronic pain - Multiple prior lower extremity surgeries and patient uses tramadol  at home  Tobacco Use - counseled on cessation, pt requested stronger nicotine patch for home; will order 21 mg patch  Discharge Diagnoses:  Principal Problem:   Small bowel obstruction (HCC) Active Problems:   Anxiety state   Chronic hepatitis C without hepatic coma (HCC)   Gastroesophageal reflux disease   Fatty liver   Chronic pain syndrome   Ileitis  Discharge Instructions:  Allergies as of 07/28/2023   No Known Allergies      Medication List     TAKE these medications    nicotine 21 mg/24hr patch Commonly known as: NICODERM CQ - dosed in mg/24 hours Place 1 patch (21 mg total) onto the skin daily for 28 days.   pantoprazole  40 MG tablet Commonly known as: PROTONIX  Take 1 tablet (40 mg total) by mouth daily.   predniSONE  10 MG tablet Commonly known as: DELTASONE  Take 4 tablets (40 mg total) by mouth daily with breakfast for 7 days, THEN 3 tablets (30 mg total) daily with breakfast for 7 days, THEN 2 tablets (20 mg total) daily with breakfast for 14 days. Start taking  on: July 29, 2023 What changed:  medication strength See the new instructions.   traMADol  50 MG tablet Commonly known as: ULTRAM  Take 1 tablet (50 mg total) by mouth 2 (two) times daily as needed for moderate pain (pain score 4-6) or severe pain (pain score 7-10). What changed: reasons to take this        Follow-up Information     Levada Raymond, [place] Follow up.   Specialty: Family Medicine Contact information: [address] [place] [address] [phone]         [facility] Follow up in  3 week(s).   Why: Hospital Follow Up Contact information: [address] [place]  [phone]               No Known Allergies Allergies as of 07/28/2023   No Known Allergies      Medication List     TAKE these medications    nicotine 21 mg/24hr patch Commonly known as: NICODERM CQ - dosed in mg/24 hours Place 1 patch (21 mg total) onto the skin daily for 28 days.   pantoprazole  40 MG tablet Commonly known as: PROTONIX  Take 1 tablet (40 mg total) by mouth daily.   predniSONE  10 MG tablet Commonly known as: DELTASONE  Take 4 tablets (40 mg total) by mouth daily with breakfast for 7 days, THEN 3 tablets (30 mg total) daily with breakfast for 7 days, THEN 2 tablets (20 mg total) daily with breakfast for 14 days. Start taking on: July 29, 2023 What changed:  medication strength See the new instructions.   traMADol  50 MG tablet Commonly known as: ULTRAM  Take 1 tablet (50 mg total) by mouth 2 (two) times daily as needed for moderate pain (pain score 4-6) or severe pain (pain score 7-10). What changed: reasons to take this        Procedures/Studies: MR 3D Recon At Scanner Result Date: 07/26/2023 CLINICAL DATA:  Abdominal pain with nausea and vomiting along with chronic common bile duct dilatation. EXAM: MRI ABDOMEN WITHOUT AND WITH CONTRAST (INCLUDING MRCP) TECHNIQUE: Multiplanar multisequence MR imaging of the abdomen was performed both before and after the administration of intravenous contrast. Heavily T2-weighted images of the biliary and pancreatic ducts were obtained, and three-dimensional MRCP images were rendered by post processing. CONTRAST:  5.5mL GADAVIST  GADOBUTROL  1 MMOL/ML IV SOLN COMPARISON:  09/05/2020, and CT scan from 07/26/2023 FINDINGS: Despite efforts by the technologist and patient, motion artifact is present on today's exam and could not be eliminated. This reduces exam sensitivity and specificity. Lower chest: Unremarkable  Hepatobiliary: Cholecystectomy. Common hepatic duct dilatation up to 1.1 cm with common bile duct dilatation up to 0.8 cm. Back on 01/06/2021 the common hepatic duct measured 1.8 cm and the common bile duct 0.9 cm. Accordingly this biliary dilatation is chronic. No filling defect is identified in the biliary tree, subject to limitations from motion artifact. This probably represents chronic physiologic dilatation of the extrahepatic biliary tree in response to cholecystectomy. No significant hepatic parenchymal lesion is identified. Pancreas:  Unremarkable Spleen:  Unremarkable Adrenals/Urinary Tract:  Unremarkable Stomach/Bowel: No dilated bowel along the upper abdomen. Small bowel loops of concern on prior CT scan more in the pelvis and not included on today's examination. Vascular/Lymphatic: Atherosclerosis is present, including aortoiliac atherosclerotic disease. Other:  No supplemental non-categorized findings. Musculoskeletal: Mild lumbar spondylosis and degenerative disc disease. IMPRESSION: 1. Chronic biliary dilatation up to 1.1 cm in the common hepatic duct and 0.8 cm in the common  bile duct, without filling defect identified in the biliary tree, subject to limitations from motion artifact. The degree of extrahepatic biliary dilatation is mildly reduced compared to 01/06/2021. This probably represents chronic physiologic dilatation of the extrahepatic biliary tree in response to cholecystectomy. 2. Aortic Atherosclerosis (ICD10-I70.0). 3. Mild lumbar spondylosis and degenerative disc disease. Electronically Signed   By: Freida Jes M.D.   On: 07/26/2023 13:52   MR ABDOMEN MRCP W WO CONTAST Result Date: 07/26/2023 CLINICAL DATA:  Abdominal pain with nausea and vomiting along with chronic common bile duct dilatation. EXAM: MRI ABDOMEN WITHOUT AND WITH CONTRAST (INCLUDING MRCP) TECHNIQUE: Multiplanar multisequence MR imaging of the abdomen was performed both before and after the administration of  intravenous contrast. Heavily T2-weighted images of the biliary and pancreatic ducts were obtained, and three-dimensional MRCP images were rendered by post processing. CONTRAST:  5.5mL GADAVIST  GADOBUTROL  1 MMOL/ML IV SOLN COMPARISON:  09/05/2020, and CT scan from 07/26/2023 FINDINGS: Despite efforts by the technologist and patient, motion artifact is present on today's exam and could not be eliminated. This reduces exam sensitivity and specificity. Lower chest: Unremarkable Hepatobiliary: Cholecystectomy. Common hepatic duct dilatation up to 1.1 cm with common bile duct dilatation up to 0.8 cm. Back on 01/06/2021 the common hepatic duct measured 1.8 cm and the common bile duct 0.9 cm. Accordingly this biliary dilatation is chronic. No filling defect is identified in the biliary tree, subject to limitations from motion artifact. This probably represents chronic physiologic dilatation of the extrahepatic biliary tree in response to cholecystectomy. No significant hepatic parenchymal lesion is identified. Pancreas:  Unremarkable Spleen:  Unremarkable Adrenals/Urinary Tract:  Unremarkable Stomach/Bowel: No dilated bowel along the upper abdomen. Small bowel loops of concern on prior CT scan more in the pelvis and not included on today's examination. Vascular/Lymphatic: Atherosclerosis is present, including aortoiliac atherosclerotic disease. Other:  No supplemental non-categorized findings. Musculoskeletal: Mild lumbar spondylosis and degenerative disc disease. IMPRESSION: 1. Chronic biliary dilatation up to 1.1 cm in the common hepatic duct and 0.8 cm in the common bile duct, without filling defect identified in the biliary tree, subject to limitations from motion artifact. The degree of extrahepatic biliary dilatation is mildly reduced compared to 01/06/2021. This probably represents chronic physiologic dilatation of the extrahepatic biliary tree in response to cholecystectomy. 2. Aortic Atherosclerosis (ICD10-I70.0).  3. Mild lumbar spondylosis and degenerative disc disease. Electronically Signed   By: Freida Jes M.D.   On: 07/26/2023 13:52   CT ABDOMEN PELVIS W CONTRAST Result Date: 07/26/2023 CLINICAL DATA:  Nonlocalized abdominal pain with nausea vomiting. EXAM: CT ABDOMEN AND PELVIS WITH CONTRAST TECHNIQUE: Multidetector CT imaging of the abdomen and pelvis was performed using the standard protocol following bolus administration of intravenous contrast. RADIATION DOSE REDUCTION: This exam was performed according to the departmental dose-optimization program which includes automated exposure control, adjustment of the mA and/or kV according to patient size and/or use of iterative reconstruction technique. CONTRAST:  OMNIPAQUE IOHEXOL 300 MG/ML  SOLN COMPARISON:  07/22/2014 FINDINGS: Lower chest: No acute findings. Hepatobiliary: No suspicious focal abnormality within the liver parenchyma. Cholecystectomy. Common bile duct is dilated up to about 11 mm diameter in the head of the pancreas with mild intrahepatic biliary duct prominence. Pancreas: No focal mass lesion. No dilatation of the main duct. No intraparenchymal cyst. No peripancreatic edema. Spleen: No splenomegaly. No suspicious focal mass lesion. Adrenals/Urinary Tract: No adrenal nodule or mass. Kidneys unremarkable. No evidence for hydroureter. The urinary bladder appears normal for the degree of distention. Stomach/Bowel:  Stomach is unremarkable. No gastric wall thickening. No evidence of outlet obstruction. Duodenum is normally positioned as is the ligament of Treitz. Duodenal diverticulum noted. No small bowel wall thickening. Proximal jejunal loops are nondilated. More distal small bowel in the pelvis is distended up to 2.7 cm diameter with fecalization of enteric contents suggesting decreased transit. Mesentery of the dilated bowel is congested/edematous. This most abnormal loop of small bowel tracks directly into a short segment of  circumferential wall thickening and enhancement along the left pelvic sidewall (see axial 53/2 and coronal 36/4). Small bowel in the right pelvis appears more decompressed although fluid and gas is visible in the terminal ileum. The appendix is normal. No gross colonic mass. No colonic wall thickening. Vascular/Lymphatic: There is moderate atherosclerotic calcification of the abdominal aorta without aneurysm. There is no gastrohepatic or hepatoduodenal ligament lymphadenopathy. No retroperitoneal or mesenteric lymphadenopathy. Upper normal lymph nodes are seen in the small bowel mesentery of the pelvis. Reproductive: Calcified uterine fibroid evident. There is no adnexal mass. Other: Trace free fluid is seen in the pelvis. Musculoskeletal: No worrisome lytic or sclerotic osseous abnormality. IMPRESSION: 1. Distal small bowel in the pelvis is distended up to 2.7 cm diameter with fecalization of enteric contents suggesting decreased transit. Mesentery of the dilated bowel is congested/edematous. This most abnormal loop of small bowel tracks directly into a short segment of luminal narrowing secondary to circumferential wall thickening and enhancement along the left pelvic sidewall. Small bowel in the right pelvis appears more decompressed although fluid and gas is visible in the terminal ileum. Imaging features suggest small bowel obstruction potentially due to inflammatory stricture although neoplastic etiology is not excluded. Findings could also reflect mechanical obstruction due to adhesion with secondary wall thickening and hyperenhancement in the small bowel. 2. Trace free fluid in the pelvis. 3. Status post cholecystectomy with dilatation of the common bile duct up to about 11 mm diameter in the head of the pancreas with mild intrahepatic biliary duct prominence. Correlation with liver function tests recommended. 4. Calcified uterine fibroid. 5.  Aortic Atherosclerosis (ICD10-I70.0). Electronically Signed   By:  Donnal Fusi M.D.   On: 07/26/2023 06:23     Subjective: Pt has been tolerating soft solid diet and had a large BM this morning. She is eager to go home today.  No complaints.    Discharge Exam: Vitals:   07/27/23 2013 07/28/23 0355  BP: 130/83 (!) 152/84  Pulse: (!) 58 69  Resp: 16 16  Temp: 97.9 F (36.6 C) 98 F (36.7 C)  SpO2: 96% 100%   Vitals:   07/27/23 0516 07/27/23 1316 07/27/23 2013 07/28/23 0355  BP: (!) 174/83 (!) 145/97 130/83 (!) 152/84  Pulse: (!) 54 (!) 59 (!) 58 69  Resp: 18  16 16   Temp: 98 F (36.7 C) 97.6 F (36.4 C) 97.9 F (36.6 C) 98 F (36.7 C)  TempSrc: Oral Oral Oral Oral  SpO2: 99% 98% 96% 100%  Weight:      Height:       General: Pt is alert, awake, not in acute distress Cardiovascular: normal S1/S2 +, no rubs, no gallops Respiratory: CTA bilaterally, no wheezing, no rhonchi Abdominal: Soft, NT, ND, bowel sounds + Extremities: no edema, no cyanosis   The results of significant diagnostics from this hospitalization (including imaging, microbiology, ancillary and laboratory) are listed below for reference.     Microbiology: No results found for this or any previous visit (from the past 240 hours).   Labs:  BNP (last 3 results) No results for input(s): BNP in the last 8760 hours. Basic Metabolic Panel: Recent Labs  Lab 07/26/23 0415 07/27/23 0459  NA 138 139  K 3.4* 3.9  CL 102 104  CO2 22 25  GLUCOSE 108* 99  BUN 16 11  CREATININE 0.69 0.61  CALCIUM 9.6 9.2  MG  --  1.8   Liver Function Tests: Recent Labs  Lab 07/26/23 0415 07/27/23 0459  AST 17 14*  ALT 13 11  ALKPHOS 86 72  BILITOT 1.0 0.5  PROT 7.8 6.7  ALBUMIN 4.0 3.4*   Recent Labs  Lab 07/26/23 0415  LIPASE 28   No results for input(s): AMMONIA in the last 168 hours. CBC: Recent Labs  Lab 07/26/23 0415 07/27/23 0459  WBC 10.4 6.6  NEUTROABS 7.9*  --   HGB 14.8 13.5  HCT 41.6 39.5  MCV 85.6 85.5  PLT 221 189   Cardiac Enzymes: No results  for input(s): CKTOTAL, CKMB, CKMBINDEX, TROPONINI in the last 168 hours. BNP: Invalid input(s): POCBNP CBG: No results for input(s): GLUCAP in the last 168 hours. D-Dimer No results for input(s): DDIMER in the last 72 hours. Hgb A1c No results for input(s): HGBA1C in the last 72 hours. Lipid Profile No results for input(s): CHOL, HDL, LDLCALC, TRIG, CHOLHDL, LDLDIRECT in the last 72 hours. Thyroid function studies No results for input(s): TSH, T4TOTAL, T3FREE, THYROIDAB in the last 72 hours.  Invalid input(s): FREET3 Anemia work up No results for input(s): VITAMINB12, FOLATE, FERRITIN, TIBC, IRON, RETICCTPCT in the last 72 hours. Urinalysis    Component Value Date/Time   COLORURINE YELLOW 07/26/2023 0646   APPEARANCEUR CLEAR 07/26/2023 0646   LABSPEC <1.005 (L) 07/26/2023 0646   PHURINE 6.0 07/26/2023 0646   GLUCOSEU NEGATIVE 07/26/2023 0646   HGBUR NEGATIVE 07/26/2023 0646   BILIRUBINUR NEGATIVE 07/26/2023 0646   KETONESUR 40 (A) 07/26/2023 0646   PROTEINUR TRACE (A) 07/26/2023 0646   UROBILINOGEN 0.2 07/22/2014 1416   NITRITE NEGATIVE 07/26/2023 0646   LEUKOCYTESUR NEGATIVE 07/26/2023 0646   Sepsis Labs Recent Labs  Lab 07/26/23 0415 07/27/23 0459  WBC 10.4 6.6   Microbiology No results found for this or any previous visit (from the past 240 hours).  Time coordinating discharge:  41 mins   SIGNED:  Faustino Hook, MD  [facility] 07/28/2023, 11:21 AM How to contact the [facility] Attending or Consulting provider 7A - 7P or covering provider during after hours 7P -7A, for this patient?  Check the care team in [facility] and look for a) attending/consulting [facility] provider listed and b) the [facility] team listed Log into [email or web address] and use [facility] universal password to access. If you do not have the password, please contact the hospital operator. Locate the [facility] provider you are looking for under [facility] and  page to a number that you can be directly reached. If you still have difficulty reaching the provider, please page the [facility] (Director on Call) for the Hospitalists listed on amion for assistance.

## 2023-07-28 NOTE — Telephone Encounter (Signed)
 Brooke Woods, patient discharged from [facility] today. She has upcoming ov July 10th. Received information from lab that TB gold test will need to be redrawn since specimen was not picked up timely from [facility].  Please arrange for TB gold test to be done at [facility]. I need her to do this next week so we have results back before her appt on July 10th. Thanks!

## 2023-07-28 NOTE — Progress Notes (Signed)
 Subjective: Patient feels well.  Tolerating diet well.  Is having bowel movements.  Objective: Vital signs in last 24 hours: Temp:  [97.6 F (36.4 C)-98 F (36.7 C)] 98 F (36.7 C) (06/19 0355) Pulse Rate:  [58-69] 69 (06/19 0355) Resp:  [16] 16 (06/19 0355) BP: (130-152)/(83-97) 152/84 (06/19 0355) SpO2:  [96 %-100 %] 100 % (06/19 0355) Last BM Date : 07/27/23  Intake/Output from previous day: 06/18 0701 - 06/19 0700 In: 1479.5 [I.V.:1479.5] Out: -  Intake/Output this shift: No intake/output data recorded.  General appearance: alert, cooperative, and no distress GI: soft, non-tender; bowel sounds normal; no masses,  no organomegaly and minimal bloating  Lab Results:  Recent Labs    07/26/23 0415 07/27/23 0459  WBC 10.4 6.6  HGB 14.8 13.5  HCT 41.6 39.5  PLT 221 189   BMET Recent Labs    07/26/23 0415 07/27/23 0459  NA 138 139  K 3.4* 3.9  CL 102 104  CO2 22 25  GLUCOSE 108* 99  BUN 16 11  CREATININE 0.69 0.61  CALCIUM 9.6 9.2   PT/INR No results for input(s): LABPROT, INR in the last 72 hours.  Studies/Results: MR 3D Recon At Scanner Result Date: 07/26/2023 CLINICAL DATA:  Abdominal pain with nausea and vomiting along with chronic common bile duct dilatation. EXAM: MRI ABDOMEN WITHOUT AND WITH CONTRAST (INCLUDING MRCP) TECHNIQUE: Multiplanar multisequence MR imaging of the abdomen was performed both before and after the administration of intravenous contrast. Heavily T2-weighted images of the biliary and pancreatic ducts were obtained, and three-dimensional MRCP images were rendered by post processing. CONTRAST:  5.5mL GADAVIST  GADOBUTROL  1 MMOL/ML IV SOLN COMPARISON:  09/05/2020, and CT scan from 07/26/2023 FINDINGS: Despite efforts by the technologist and patient, motion artifact is present on today's exam and could not be eliminated. This reduces exam sensitivity and specificity. Lower chest: Unremarkable Hepatobiliary: Cholecystectomy. Common hepatic  duct dilatation up to 1.1 cm with common bile duct dilatation up to 0.8 cm. Back on 01/06/2021 the common hepatic duct measured 1.8 cm and the common bile duct 0.9 cm. Accordingly this biliary dilatation is chronic. No filling defect is identified in the biliary tree, subject to limitations from motion artifact. This probably represents chronic physiologic dilatation of the extrahepatic biliary tree in response to cholecystectomy. No significant hepatic parenchymal lesion is identified. Pancreas:  Unremarkable Spleen:  Unremarkable Adrenals/Urinary Tract:  Unremarkable Stomach/Bowel: No dilated bowel along the upper abdomen. Small bowel loops of concern on prior CT scan more in the pelvis and not included on today's examination. Vascular/Lymphatic: Atherosclerosis is present, including aortoiliac atherosclerotic disease. Other:  No supplemental non-categorized findings. Musculoskeletal: Mild lumbar spondylosis and degenerative disc disease. IMPRESSION: 1. Chronic biliary dilatation up to 1.1 cm in the common hepatic duct and 0.8 cm in the common bile duct, without filling defect identified in the biliary tree, subject to limitations from motion artifact. The degree of extrahepatic biliary dilatation is mildly reduced compared to 01/06/2021. This probably represents chronic physiologic dilatation of the extrahepatic biliary tree in response to cholecystectomy. 2. Aortic Atherosclerosis (ICD10-I70.0). 3. Mild lumbar spondylosis and degenerative disc disease. Electronically Signed   By: Freida Jes M.D.   On: 07/26/2023 13:52   MR ABDOMEN MRCP W WO CONTAST Result Date: 07/26/2023 CLINICAL DATA:  Abdominal pain with nausea and vomiting along with chronic common bile duct dilatation. EXAM: MRI ABDOMEN WITHOUT AND WITH CONTRAST (INCLUDING MRCP) TECHNIQUE: Multiplanar multisequence MR imaging of the abdomen was performed both before and after the administration  of intravenous contrast. Heavily T2-weighted images  of the biliary and pancreatic ducts were obtained, and three-dimensional MRCP images were rendered by post processing. CONTRAST:  5.5mL GADAVIST  GADOBUTROL  1 MMOL/ML IV SOLN COMPARISON:  09/05/2020, and CT scan from 07/26/2023 FINDINGS: Despite efforts by the technologist and patient, motion artifact is present on today's exam and could not be eliminated. This reduces exam sensitivity and specificity. Lower chest: Unremarkable Hepatobiliary: Cholecystectomy. Common hepatic duct dilatation up to 1.1 cm with common bile duct dilatation up to 0.8 cm. Back on 01/06/2021 the common hepatic duct measured 1.8 cm and the common bile duct 0.9 cm. Accordingly this biliary dilatation is chronic. No filling defect is identified in the biliary tree, subject to limitations from motion artifact. This probably represents chronic physiologic dilatation of the extrahepatic biliary tree in response to cholecystectomy. No significant hepatic parenchymal lesion is identified. Pancreas:  Unremarkable Spleen:  Unremarkable Adrenals/Urinary Tract:  Unremarkable Stomach/Bowel: No dilated bowel along the upper abdomen. Small bowel loops of concern on prior CT scan more in the pelvis and not included on today's examination. Vascular/Lymphatic: Atherosclerosis is present, including aortoiliac atherosclerotic disease. Other:  No supplemental non-categorized findings. Musculoskeletal: Mild lumbar spondylosis and degenerative disc disease. IMPRESSION: 1. Chronic biliary dilatation up to 1.1 cm in the common hepatic duct and 0.8 cm in the common bile duct, without filling defect identified in the biliary tree, subject to limitations from motion artifact. The degree of extrahepatic biliary dilatation is mildly reduced compared to 01/06/2021. This probably represents chronic physiologic dilatation of the extrahepatic biliary tree in response to cholecystectomy. 2. Aortic Atherosclerosis (ICD10-I70.0). 3. Mild lumbar spondylosis and degenerative disc  disease. Electronically Signed   By: Freida Jes M.D.   On: 07/26/2023 13:52    Anti-infectives: Anti-infectives (From admission, onward)    None       Assessment/Plan: Impression: Partial small bowel obstruction, resolved.  Appreciate GI input.  No need for surgical intervention.  Okay for discharge from surgery standpoint.  Follow-up has been arranged with GI.  LOS: 2 days    Alanda Allegra 07/28/2023

## 2023-07-28 NOTE — Care Management Important Message (Signed)
 Important Message  Patient Details  Name: Brooke Woods MRN: [id number] Date of Birth: 04-Feb-1971   Important Message Given:  N/A - LOS <3 / Initial given by admissions     Neila Bally 07/28/2023, 11:27 AM

## 2023-07-28 NOTE — Telephone Encounter (Signed)
 Pt was made aware and stated that she will go to have it drawn on Monday.

## 2023-07-28 NOTE — Plan of Care (Signed)

## 2023-07-29 LAB — QUANTIFERON-TB GOLD PLUS

## 2023-08-01 ENCOUNTER — Other Ambulatory Visit: Payer: Self-pay | Admitting: Gastroenterology

## 2023-08-01 DIAGNOSIS — K529 Noninfective gastroenteritis and colitis, unspecified: Secondary | ICD-10-CM | POA: Diagnosis not present

## 2023-08-01 MED ORDER — ONDANSETRON 4 MG PO TBDP: 4.0000 mg | ORAL_TABLET | Freq: Three times a day (TID) | ORAL | 1 refills | Status: DC | PRN

## 2023-08-04 LAB — QUANTIFERON-TB GOLD PLUS
QuantiFERON Mitogen Value: 4.47 [IU]/mL
QuantiFERON Nil Value: 0.04 [IU]/mL
QuantiFERON TB1 Ag Value: 0.04 [IU]/mL
QuantiFERON TB2 Ag Value: 0.04 [IU]/mL

## 2023-08-12 ENCOUNTER — Ambulatory Visit: Payer: Self-pay | Admitting: Gastroenterology

## 2023-08-15 ENCOUNTER — Ambulatory Visit: Attending: Cardiology | Admitting: Cardiology

## 2023-08-15 ENCOUNTER — Encounter: Payer: Self-pay | Admitting: Cardiology

## 2023-08-15 VITALS — BP 122/78 | HR 75

## 2023-08-15 DIAGNOSIS — R9431 Abnormal electrocardiogram [ECG] [EKG]: Secondary | ICD-10-CM

## 2023-08-15 DIAGNOSIS — Z720 Tobacco use: Secondary | ICD-10-CM

## 2023-08-15 DIAGNOSIS — R011 Cardiac murmur, unspecified: Secondary | ICD-10-CM

## 2023-08-15 NOTE — Patient Instructions (Signed)
 Medication Instructions:  No medication changes were made during today's visit.  *If you need a refill on your cardiac medications before your next appointment, please call your pharmacy*   Lab Work: No labs were ordered during today's visit.  If you have labs (blood work) drawn today and your tests are completely normal, you will receive your results only by: MyChart Message (if you have MyChart) OR A paper copy in the mail If you have any lab test that is abnormal or we need to change your treatment, we will call you to review the results.   Testing/Procedures: Your physician has requested that you have an echocardiogram. Echocardiography is a painless test that uses sound waves to create images of your heart. It provides your doctor with information about the size and shape of your heart and how well your heart's chambers and valves are working. This procedure takes approximately one hour. There are no restrictions for this procedure. Please do NOT wear cologne, perfume, aftershave, or lotions (deodorant is allowed). Please arrive 15 minutes prior to your appointment time.  Please note: We ask at that you not bring children with you during ultrasound (echo/ vascular) testing. Due to room size and safety concerns, children are not allowed in the ultrasound rooms during exams. Our front office staff cannot provide observation of children in our lobby area while testing is being conducted. An adult accompanying a patient to their appointment will only be allowed in the ultrasound room at the discretion of the ultrasound technician under special circumstances. We apologize for any inconvenience.      CT coronary calcium score.   Test locations:  [facility]  [facility]  This is $99 out of pocket.   Coronary CalciumScan A coronary calcium scan is an imaging test used to look for deposits of  calcium and other fatty materials (plaques) in the inner lining of the blood vessels of the heart (coronary arteries). These deposits of calcium and plaques can partly clog and narrow the coronary arteries without producing any symptoms or warning signs. This puts a person at risk for a heart attack. This test can detect these deposits before symptoms develop. Tell a health care provider about: Any allergies you have. All medicines you are taking, including vitamins, herbs, eye drops, creams, and over-the-counter medicines. Any problems you or family members have had with anesthetic medicines. Any blood disorders you have. Any surgeries you have had. Any medical conditions you have. Whether you are pregnant or may be pregnant. What are the risks? Generally, this is a safe procedure. However, problems may occur, including: Harm to a pregnant woman and her unborn baby. This test involves the use of radiation. Radiation exposure can be dangerous to a pregnant woman and her unborn baby. If you are pregnant, you generally should not have this procedure done. Slight increase in the risk of cancer. This is because of the radiation involved in the test. What happens before the procedure? No preparation is needed for this procedure. What happens during the procedure? You will undress and remove any jewelry around your neck or chest. You will put on a hospital gown. Sticky electrodes will be placed on your chest. The electrodes will be connected to an electrocardiogram (ECG) machine to record a tracing of the electrical activity of your heart. A CT scanner will take pictures of your heart. During this time, you will be asked to lie still and  hold your breath for 2-3 seconds while a picture of your heart is being taken. The procedure may vary among health care providers and hospitals. What happens after the procedure? You can get dressed. You can return to your normal activities. It is up to you to get  the results of your test. Ask your health care provider, or the department that is doing the test, when your results will be ready. Summary A coronary calcium scan is an imaging test used to look for deposits of calcium and other fatty materials (plaques) in the inner lining of the blood vessels of the heart (coronary arteries). Generally, this is a safe procedure. Tell your health care provider if you are pregnant or may be pregnant. No preparation is needed for this procedure. A CT scanner will take pictures of your heart. You can return to your normal activities after the scan is done. This information is not intended to replace advice given to you by your health care provider. Make sure you discuss any questions you have with your health care provider. Document Released: 07/24/2007 Document Revised: 12/15/2015 Document Reviewed: 12/15/2015 Elsevier Interactive Patient Education  2017 [employer].    Follow-Up: At [facility], you and your health needs are our priority.  As part of our continuing mission to provide you with exceptional heart care, we have created designated Provider Care Teams.  These Care Teams include your primary Cardiologist (physician) and Advanced Practice Providers (APPs -  Physician Assistants and Nurse Practitioners) who all work together to provide you with the care you need, when you need it.  We recommend signing up for the patient portal called MyChart.  Sign up information is provided on this After Visit Summary.  MyChart is used to connect with patients for Virtual Visits (Telemedicine).  Patients are able to view lab/test results, encounter notes, upcoming appointments, etc.  Non-urgent messages can be sent to your provider as well.   To learn more about what you can do with MyChart, go to [email or web address].    Your next appointment:   3 month(s)  Provider:   Rollo Louder, PA-C          Other Instructions Thank you for choosing [facility]!

## 2023-08-15 NOTE — Progress Notes (Signed)
 Cardiology Office Note   Date:  08/15/2023  ID:  Brooke Woods, DOB [birth year], MRN [id number] PCP: Job Bolt, PA   HeartCare Providers Cardiologist:  None  History of Present Illness Brooke Woods is a 53 y.o. female with past medical history of GERD, chronic hepatitis C, recent SBO in 07/2023 that was managed medically, chronic pain.  Patient presents today for evaluation of abnormal EKG and chest tightness  Patient was recently admitted to [facility] 6/17 - 6/19.  She had presented complaining of epigastric abdominal pain, nausea, vomiting.  CT scan showed findings consistent with SBO.  Given IV fluids, Zofran , Dilaudid .  Had a bowel movement 6/18.  Able to tolerate diet and was discharged home.  Patient was seen by PCP after discharge and reported having some left chest tightness.  She felt this may have been related to her left breast.  She had an EKG that was reportedly normal and was referred to cardiology  Today, patient reports that she has overall been feeling well recently.  She was recently admitted to [facility] in 07/2023 with a small bowel obstruction.  Has recovered well.  Believe that she was referred for abnormal EKG, also  was told that her PCP hurts something on exam.  Reports that her PCP had ordered an EKG because she had some left chest tightness.  This tightness occurred randomly and was not associated with exertion.  She felt it may have actually been breast tightness/soreness.  She has not had any tightness/soreness for over a month.  She has been more active recently and walks a mile every day with her grandchildren.  Denies any chest pain or pressure with exertion.  Denies dyspnea with exertion.  Denies palpitations, dizziness, syncope, near syncope.  Denies lower extremity swelling.  She has been smoking for about 40 years and is working on quitting.  She was previously on high blood pressure and cholesterol medications but was taken off  them about 10-15 years ago.  Denies family history of heart problems   Studies Reviewed  Risk Assessment/Calculations           Physical Exam VS:  BP 122/78   Pulse 75   LMP 05/26/2016   SpO2 99%        Wt Readings from Last 3 Encounters:  07/26/23 110 lb 7.2 oz (50.1 kg)  05/23/23 114 lb 6.4 oz (51.9 kg)  04/19/23 110 lb (49.9 kg)    GEN: Well nourished, well developed in no acute distress. Sitting comfortably on the exam table  NECK: No JVD  CARDIAC:  RRR. Faint systolic murmur at RUSB  RESPIRATORY:  Clear to auscultation without rales, wheezing or rhonchi. Normal WOB on room air   ABDOMEN: Soft, non-tender, non-distended EXTREMITIES:  No edema in BLE; No deformity   ASSESSMENT AND PLAN  Abnormal EKG  Heart Murmur  - Patient was referred by PCP for abnormal EKG and heart murmur.  EKG today shows sinus rhythm, incomplete right bundle branch block.  Incomplete RBBB has been present on EKG since 2016 - On exam, patient does have faint systolic murmur at right upper sternal border - Denies chest pain or shortness of breath on exertion.  Denies palpitations, dizziness, syncope, near syncope - Ordered echocardiogram for evaluation of cardiac murmur  Chest pain  - Patient reports that she use to have episodes of random left-sided chest tightness.  She thought that this may be breast discomfort.  Not exertional. - She has not  had any chest tightness in over a month.  She stays active by walking a mile with her grandchildren.  She does not have chest pain or dyspnea on exertion -  EKG without ischemic changes - Ordered echo as above  -  patient does have tobacco use history.  She was previously on cholesterol and high blood pressure medications but has not been on for 10+ years. To better assess risk, recommended CT calcium score.  Patient understands that she will have to pay for this out-of-pocket and agrees to proceed  Tobacco Use  - Counseled on tobacco cessation   Dispo:  Follow up in 3 months with APP   Signed, Rollo FABIENE Louder, PA-C

## 2023-08-17 NOTE — Progress Notes (Unsigned)
 Referring Provider: Job Bolt, PA Primary Care Physician:  Job Bolt, [place] Primary GI Physician: Dr. Cindie  No chief complaint on file.   HPI:   Brooke Woods is a 53 y.o. female with history of anxiety, depression, asthma, TIA in 2012, chronic pain, hepatitis C s/p treatment with Epclusa achieving SVR, GERD, adenomatous colon polyps, nonspecific ileitis noted on colonoscopy in March 2025.   Recent admission in June 2025 for SBO possibly due to inflammatory stricture vs mechanical obstruction due to adhesion with secondary wall thickening and hyperenhancement in the small bowel, and unable to exclude neoplastic etiology. Suspected this is more likely related to a chronic inflammatory stricture with differentials including Crohn's disease versus NSAID induced inflammatory stricture(taking ibuprofen  frequently until 1 month ago).  Started empirically on IV steroids.  CRP was 0.6.  Hep B surface antigen and QuantiFERON-TB gold negative.  SBO resolved with supportive measures.  She was discharged home on oral prednisone  taper.   Today:      Past Medical History:  Diagnosis Date   Anxiety    Asthmatic bronchitis    Chronic pain    Depression    Hepatitis C    genoptype 1 a, s/p treatment with Epclusa.   Stroke [facility])    Pt reported TIA 04/27/10    Past Surgical History:  Procedure Laterality Date   CHOLECYSTECTOMY  2007   COLONOSCOPY WITH PROPOFOL  N/A 04/19/2023   Procedure: COLONOSCOPY WITH PROPOFOL ;  Surgeon: Cindie Carlin POUR, DO;  Location: [facility];  Service: Endoscopy;  Laterality: N/A;  9:30 am, asa 2   FOOT SURGERY     x3 on right and 1 on left.   KNEE SURGERY     OVARY SURGERY     right oophrectomy   POLYPECTOMY  04/19/2023   Procedure: POLYPECTOMY;  Surgeon: Cindie Carlin POUR, DO;  Location: [facility];  Service: Endoscopy;;   TUBAL LIGATION      Current Outpatient Medications  Medication Sig Dispense Refill   nicotine  (NICODERM CQ  - DOSED IN  MG/24 HOURS) 21 mg/24hr patch Place 1 patch (21 mg total) onto the skin daily for 28 days. 28 patch 0   ondansetron  (ZOFRAN -ODT) 4 MG disintegrating tablet Take 1 tablet (4 mg total) by mouth every 8 (eight) hours as needed for nausea or vomiting. 30 tablet 1   pantoprazole  (PROTONIX ) 40 MG tablet Take 1 tablet (40 mg total) by mouth daily. 90 tablet 3   predniSONE  (DELTASONE ) 10 MG tablet Take 4 tablets (40 mg total) by mouth daily with breakfast for 7 days, THEN 3 tablets (30 mg total) daily with breakfast for 7 days, THEN 2 tablets (20 mg total) daily with breakfast for 14 days. 77 tablet 0   traMADol  (ULTRAM ) 50 MG tablet Take 1 tablet (50 mg total) by mouth 2 (two) times daily as needed for moderate pain (pain score 4-6) or severe pain (pain score 7-10). 20 tablet 0   No current facility-administered medications for this visit.    Allergies as of 08/18/2023   (No Known Allergies)    Family History  Problem Relation Age of Onset   Hypertension Mother    Hyperlipidemia Mother    Depression Mother    Cancer Father    Hypertension Father    Hyperlipidemia Father    Cirrhosis Father        alcoholic   Hyperlipidemia Sister    Hyperlipidemia Brother    Depression Brother    Hepatitis C Brother  Colon cancer Neg Hx     Social History   Socioeconomic History   Marital status: Single    Spouse name: Not on file   Number of children: Not on file   Years of education: Not on file   Highest education level: Not on file  Occupational History   Not on file  Tobacco Use   Smoking status: Every Day    Current packs/day: 1.00    Types: Cigarettes   Smokeless tobacco: Never  Vaping Use   Vaping status: Never Used  Substance and Sexual Activity   Alcohol use: No    Comment: None in 3 years   Drug use: Yes    Types: Marijuana    Comment: pain pills previously, none since 2019.   Sexual activity: Yes    Birth control/protection: Surgical  Other Topics Concern   Not on file   Social History Narrative   Not on file   Social Drivers of Health   Financial Resource Strain: Not on file  Food Insecurity: No Food Insecurity (07/26/2023)   Hunger Vital Sign    Worried About Running Out of Food in the Last Year: Never true    Ran Out of Food in the Last Year: Never true  Transportation Needs: No Transportation Needs (07/26/2023)   PRAPARE - Administrator, Civil Service (Medical): No    Lack of Transportation (Non-Medical): No  Physical Activity: Not on file  Stress: Not on file  Social Connections: Not on file    Review of Systems: Gen: Denies fever, chills, anorexia. Denies fatigue, weakness, weight loss.  CV: Denies chest pain, palpitations, syncope, peripheral edema, and claudication. Resp: Denies dyspnea at rest, cough, wheezing, coughing up blood, and pleurisy. GI: Denies vomiting blood, jaundice, and fecal incontinence.   Denies dysphagia or odynophagia. Derm: Denies rash, itching, dry skin Psych: Denies depression, anxiety, memory loss, confusion. No homicidal or suicidal ideation.  Heme: Denies bruising, bleeding, and enlarged lymph nodes.  Physical Exam: LMP 05/26/2016  General:   Alert and oriented. No distress noted. Pleasant and cooperative.  Head:  Normocephalic and atraumatic. Eyes:  Conjuctiva clear without scleral icterus. Heart:  S1, S2 present without murmurs appreciated. Lungs:  Clear to auscultation bilaterally. No wheezes, rales, or rhonchi. No distress.  Abdomen:  +BS, soft, non-tender and non-distended. No rebound or guarding. No HSM or masses noted. Msk:  Symmetrical without gross deformities. Normal posture. Extremities:  Without edema. Neurologic:  Alert and  oriented x4 Psych:  Normal mood and affect.    Assessment:     Plan:  ***   Josette Centers, PA-C [facility] 08/18/2023

## 2023-08-18 ENCOUNTER — Encounter: Payer: Self-pay | Admitting: Gastroenterology

## 2023-08-18 ENCOUNTER — Ambulatory Visit: Admitting: Gastroenterology

## 2023-08-18 VITALS — BP 160/84 | HR 85 | Temp 99.1°F | Ht 67.0 in | Wt 110.4 lb

## 2023-08-18 DIAGNOSIS — K56609 Unspecified intestinal obstruction, unspecified as to partial versus complete obstruction: Secondary | ICD-10-CM | POA: Diagnosis not present

## 2023-08-18 DIAGNOSIS — R030 Elevated blood-pressure reading, without diagnosis of hypertension: Secondary | ICD-10-CM

## 2023-08-18 DIAGNOSIS — K56699 Other intestinal obstruction unspecified as to partial versus complete obstruction: Secondary | ICD-10-CM

## 2023-08-18 NOTE — Patient Instructions (Addendum)
 Complete your course of prednisone  as you are.  I will talk with Dr. Cindie when he returns on Monday about next steps in management/evaluation.   Your blood pressure was elevated today.  My CMA is going to recheck this before you leave the office.  If your blood pressure is persistently elevated, I recommend that you recheck your blood pressure when you get home.  If blood pressure remains elevated, greater than 160/90 today, I recommend that you contact your cardiologist.  I would recommend that you continue to check your blood pressure daily and keep a record.  If you notice that your blood pressure is consistently greater than 140/90, I also recommend that you follow-up with cardiology or your primary care doctor.  Josette Centers, PA-C [facility]

## 2023-08-19 ENCOUNTER — Other Ambulatory Visit (HOSPITAL_COMMUNITY)

## 2023-08-19 DIAGNOSIS — G8929 Other chronic pain: Secondary | ICD-10-CM | POA: Diagnosis not present

## 2023-08-19 DIAGNOSIS — Z681 Body mass index (BMI) 19 or less, adult: Secondary | ICD-10-CM | POA: Diagnosis not present

## 2023-08-19 DIAGNOSIS — I10 Essential (primary) hypertension: Secondary | ICD-10-CM | POA: Diagnosis not present

## 2023-08-19 DIAGNOSIS — K529 Noninfective gastroenteritis and colitis, unspecified: Secondary | ICD-10-CM | POA: Diagnosis not present

## 2023-08-22 ENCOUNTER — Telehealth: Payer: Self-pay | Admitting: Gastroenterology

## 2023-08-22 NOTE — Telephone Encounter (Signed)
 Courtney, when patient messages back with dose of lisinopril, can you add it to her medication list?

## 2023-08-22 NOTE — Telephone Encounter (Signed)
 Charmaine, Can you let patient know that I discussed her case with Dr. Cindie.  She will complete her current course of prednisone , then stop.  We will set her up for a colonoscopy to reevaluate her previously inflamed small bowel to determine if this has resolved or if this is an ongoing process that needs additional treatment.   Mindy/Tammy: Please arrange colonoscopy with Dr. Cindie. ASA 2 Dx: Ileitis, small bowel stricture, history of SBO

## 2023-08-23 ENCOUNTER — Other Ambulatory Visit: Payer: Self-pay | Admitting: *Deleted

## 2023-08-23 MED ORDER — PEG 3350-KCL-NA BICARB-NACL 420 G PO SOLR: 4000.0000 mL | Freq: Once | ORAL | 0 refills | Status: AC

## 2023-08-23 NOTE — Telephone Encounter (Signed)
 PT AWARE. Sscheduled for 7/28. Aware will send instructions to her mychart and rx for prep to her pharmacy

## 2023-08-23 NOTE — Addendum Note (Signed)
 Addended by: JEANELL GRAEME RAMAN on: 08/23/2023 08:29 AM   Modules accepted: Orders

## 2023-08-23 NOTE — Telephone Encounter (Signed)
 Spoke pt, informed her of results and recommendations. Pt voiced understanding.

## 2023-08-25 ENCOUNTER — Telehealth: Payer: Self-pay | Admitting: *Deleted

## 2023-08-25 NOTE — Telephone Encounter (Signed)
 Received message from endo she needs to reschedule procedure Called pt, LMOVM to call back

## 2023-08-27 DIAGNOSIS — R030 Elevated blood-pressure reading, without diagnosis of hypertension: Secondary | ICD-10-CM | POA: Diagnosis not present

## 2023-08-27 DIAGNOSIS — F17200 Nicotine dependence, unspecified, uncomplicated: Secondary | ICD-10-CM | POA: Diagnosis not present

## 2023-08-27 DIAGNOSIS — Z8719 Personal history of other diseases of the digestive system: Secondary | ICD-10-CM | POA: Diagnosis not present

## 2023-08-27 DIAGNOSIS — G8929 Other chronic pain: Secondary | ICD-10-CM | POA: Diagnosis not present

## 2023-08-31 ENCOUNTER — Encounter (HOSPITAL_COMMUNITY)

## 2023-09-02 ENCOUNTER — Ambulatory Visit: Payer: Self-pay | Admitting: Cardiology

## 2023-09-02 ENCOUNTER — Ambulatory Visit (HOSPITAL_COMMUNITY)
Admission: RE | Admit: 2023-09-02 | Discharge: 2023-09-02 | Disposition: A | Discharge status: Home / Self Care | Source: Ambulatory Visit | Attending: Cardiology | Admitting: Cardiology

## 2023-09-02 DIAGNOSIS — R011 Cardiac murmur, unspecified: Secondary | ICD-10-CM | POA: Insufficient documentation

## 2023-09-02 LAB — ECHOCARDIOGRAM COMPLETE
AR max vel: 1.96 cm2
AV Area VTI: 1.83 cm2
AV Area mean vel: 1.68 cm2
AV Mean grad: 5 mmHg
AV Peak grad: 10 mmHg
Ao pk vel: 1.58 m/s
Area-P 1/2: 5.42 cm2
Calc EF: 60.6 %
MV VTI: 2.34 cm2
S' Lateral: 2.5 cm
Single Plane A2C EF: 60.2 %
Single Plane A4C EF: 60.3 %

## 2023-09-05 ENCOUNTER — Ambulatory Visit (HOSPITAL_COMMUNITY)

## 2023-09-05 DIAGNOSIS — Z681 Body mass index (BMI) 19 or less, adult: Secondary | ICD-10-CM | POA: Diagnosis not present

## 2023-09-05 DIAGNOSIS — G8929 Other chronic pain: Secondary | ICD-10-CM | POA: Diagnosis not present

## 2023-09-05 DIAGNOSIS — R030 Elevated blood-pressure reading, without diagnosis of hypertension: Secondary | ICD-10-CM | POA: Diagnosis not present

## 2023-09-06 NOTE — Telephone Encounter (Signed)
-----   Message from Rollo FABIENE Louder sent at 09/02/2023  1:43 PM EDT ----- Please tell patient that her recent echocardiogram showed EF (pump strength of the heart) was normal at 60-65%, no regional wall motion abnormalities. RV normal size and function. The aortic valve  had some calcium on it, which is likely what caused her murmur on exam. Blood flow is normal through the valve.   Overall reassuring echo. No changes to treatment plan   Thanks KJ  ----- Message ----- From: Interface, [employer] Sent: 09/02/2023  12:40 PM EDT To: Rollo JONELLE Louder, PA-C

## 2023-09-06 NOTE — Telephone Encounter (Signed)
 Called patient advised of below they verbalized understanding.

## 2023-09-07 ENCOUNTER — Other Ambulatory Visit (HOSPITAL_COMMUNITY)

## 2023-09-11 DIAGNOSIS — Z79899 Other long term (current) drug therapy: Secondary | ICD-10-CM | POA: Diagnosis not present

## 2023-09-11 DIAGNOSIS — M79671 Pain in right foot: Secondary | ICD-10-CM | POA: Diagnosis not present

## 2023-09-11 DIAGNOSIS — F17210 Nicotine dependence, cigarettes, uncomplicated: Secondary | ICD-10-CM | POA: Diagnosis not present

## 2023-09-11 DIAGNOSIS — D539 Nutritional anemia, unspecified: Secondary | ICD-10-CM | POA: Diagnosis not present

## 2023-09-11 DIAGNOSIS — M129 Arthropathy, unspecified: Secondary | ICD-10-CM | POA: Diagnosis not present

## 2023-09-11 DIAGNOSIS — R5383 Other fatigue: Secondary | ICD-10-CM | POA: Diagnosis not present

## 2023-09-11 DIAGNOSIS — E7800 Pure hypercholesterolemia, unspecified: Secondary | ICD-10-CM | POA: Diagnosis not present

## 2023-09-11 DIAGNOSIS — Z131 Encounter for screening for diabetes mellitus: Secondary | ICD-10-CM | POA: Diagnosis not present

## 2023-09-11 DIAGNOSIS — M79672 Pain in left foot: Secondary | ICD-10-CM | POA: Diagnosis not present

## 2023-09-11 DIAGNOSIS — R0602 Shortness of breath: Secondary | ICD-10-CM | POA: Diagnosis not present

## 2023-09-11 DIAGNOSIS — M549 Dorsalgia, unspecified: Secondary | ICD-10-CM | POA: Diagnosis not present

## 2023-09-11 DIAGNOSIS — E559 Vitamin D deficiency, unspecified: Secondary | ICD-10-CM | POA: Diagnosis not present

## 2023-09-14 DIAGNOSIS — Z79899 Other long term (current) drug therapy: Secondary | ICD-10-CM | POA: Diagnosis not present

## 2023-09-20 ENCOUNTER — Other Ambulatory Visit: Payer: Self-pay

## 2023-09-20 ENCOUNTER — Ambulatory Visit (HOSPITAL_COMMUNITY)
Admission: RE | Admit: 2023-09-20 | Discharge: 2023-09-20 | Disposition: A | Discharge status: Home / Self Care | Attending: Internal Medicine | Admitting: Internal Medicine

## 2023-09-20 ENCOUNTER — Ambulatory Visit (HOSPITAL_COMMUNITY): Admitting: Anesthesiology

## 2023-09-20 ENCOUNTER — Ambulatory Visit (HOSPITAL_BASED_OUTPATIENT_CLINIC_OR_DEPARTMENT_OTHER): Admitting: Anesthesiology

## 2023-09-20 ENCOUNTER — Encounter (HOSPITAL_COMMUNITY)
Admission: RE | Disposition: A | Discharge status: Home / Self Care | Payer: Self-pay | Source: Home / Self Care | Attending: Internal Medicine

## 2023-09-20 ENCOUNTER — Encounter (HOSPITAL_COMMUNITY): Payer: Self-pay | Admitting: Internal Medicine

## 2023-09-20 DIAGNOSIS — K633 Ulcer of intestine: Secondary | ICD-10-CM | POA: Diagnosis not present

## 2023-09-20 DIAGNOSIS — J45909 Unspecified asthma, uncomplicated: Secondary | ICD-10-CM | POA: Diagnosis not present

## 2023-09-20 DIAGNOSIS — I10 Essential (primary) hypertension: Secondary | ICD-10-CM | POA: Diagnosis not present

## 2023-09-20 DIAGNOSIS — F419 Anxiety disorder, unspecified: Secondary | ICD-10-CM | POA: Insufficient documentation

## 2023-09-20 DIAGNOSIS — Z8673 Personal history of transient ischemic attack (TIA), and cerebral infarction without residual deficits: Secondary | ICD-10-CM | POA: Diagnosis not present

## 2023-09-20 DIAGNOSIS — F32A Depression, unspecified: Secondary | ICD-10-CM | POA: Insufficient documentation

## 2023-09-20 DIAGNOSIS — K56609 Unspecified intestinal obstruction, unspecified as to partial versus complete obstruction: Secondary | ICD-10-CM | POA: Diagnosis not present

## 2023-09-20 DIAGNOSIS — R948 Abnormal results of function studies of other organs and systems: Secondary | ICD-10-CM | POA: Diagnosis not present

## 2023-09-20 DIAGNOSIS — K6389 Other specified diseases of intestine: Secondary | ICD-10-CM | POA: Diagnosis not present

## 2023-09-20 DIAGNOSIS — F17210 Nicotine dependence, cigarettes, uncomplicated: Secondary | ICD-10-CM | POA: Insufficient documentation

## 2023-09-20 DIAGNOSIS — F17200 Nicotine dependence, unspecified, uncomplicated: Secondary | ICD-10-CM

## 2023-09-20 DIAGNOSIS — K5730 Diverticulosis of large intestine without perforation or abscess without bleeding: Secondary | ICD-10-CM

## 2023-09-20 DIAGNOSIS — K56699 Other intestinal obstruction unspecified as to partial versus complete obstruction: Secondary | ICD-10-CM | POA: Insufficient documentation

## 2023-09-20 DIAGNOSIS — K529 Noninfective gastroenteritis and colitis, unspecified: Secondary | ICD-10-CM | POA: Insufficient documentation

## 2023-09-20 HISTORY — PX: COLONOSCOPY: SHX5424

## 2023-09-20 SURGERY — COLONOSCOPY
Anesthesia: General

## 2023-09-20 MED ORDER — LACTATED RINGERS IV SOLN: INTRAVENOUS | Status: DC | PRN

## 2023-09-20 MED ORDER — PROPOFOL 500 MG/50ML IV EMUL
INTRAVENOUS | Status: DC | PRN
Start: 2023-09-20 — End: 2023-09-20
  Administered 2023-09-20 (×2): 100 ug/kg/min via INTRAVENOUS
  Administered 2023-09-20: 40 mg via INTRAVENOUS
  Administered 2023-09-20 (×2): 120 mg via INTRAVENOUS
  Administered 2023-09-20: 40 mg via INTRAVENOUS

## 2023-09-20 NOTE — Discharge Instructions (Addendum)
  Colonoscopy Discharge Instructions  Read the instructions outlined below and refer to this sheet in the next few weeks. These discharge instructions provide you with general information on caring for yourself after you leave the hospital. Your doctor may also give you specific instructions. While your treatment has been planned according to the most current medical practices available, unavoidable complications occasionally occur.   ACTIVITY You may resume your regular activity, but move at a slower pace for the next 24 hours.  Take frequent rest periods for the next 24 hours.  Walking will help get rid of the air and reduce the bloated feeling in your belly (abdomen).  No driving for 24 hours (because of the medicine (anesthesia) used during the test).   Do not sign any important legal documents or operate any machinery for 24 hours (because of the anesthesia used during the test).  NUTRITION Drink plenty of fluids.  You may resume your normal diet as instructed by your doctor.  Begin with a light meal and progress to your normal diet. Heavy or fried foods are harder to digest and may make you feel sick to your stomach (nauseated).  Avoid alcoholic beverages for 24 hours or as instructed.  MEDICATIONS You may resume your normal medications unless your doctor tells you otherwise.  WHAT YOU CAN EXPECT TODAY Some feelings of bloating in the abdomen.  Passage of more gas than usual.  Spotting of blood in your stool or on the toilet paper.  IF YOU HAD POLYPS REMOVED DURING THE COLONOSCOPY: No aspirin  products for 7 days or as instructed.  No alcohol for 7 days or as instructed.  Eat a soft diet for the next 24 hours.  FINDING OUT THE RESULTS OF YOUR TEST Not all test results are available during your visit. If your test results are not back during the visit, make an appointment with your caregiver to find out the results. Do not assume everything is normal if you have not heard from your  caregiver or the medical facility. It is important for you to follow up on all of your test results.  SEEK IMMEDIATE MEDICAL ATTENTION IF: You have more than a spotting of blood in your stool.  Your belly is swollen (abdominal distention).  You are nauseated or vomiting.  You have a temperature over 101.  You have abdominal pain or discomfort that is severe or gets worse throughout the day.   Your colonoscopy revealed numerous small ulcers in the end portion of your small bowel.  I rebiopsied this area again today.  Await pathology results, my office will contact you.  Exam otherwise unremarkable.  Follow-up in GI office in 6 weeks.  I hope you have a great rest of your week!  Carlin POUR. Cindie, D.O. Gastroenterology and Hepatology [facility]

## 2023-09-20 NOTE — Transfer of Care (Signed)
 Immediate Anesthesia Transfer of Care Note  Patient: Brooke Woods  Procedure(s) Performed: COLONOSCOPY  Patient Location: Endoscopy Unit  Anesthesia Type:General  Level of Consciousness: awake and patient cooperative  Airway & Oxygen Therapy: Patient Spontanous Breathing  Post-op Assessment: Report given to RN and Post -op Vital signs reviewed and stable  Post vital signs: Reviewed and stable  Last Vitals:  Vitals Value Taken Time  BP 134/87 09/20/23 11:41  Temp 36.4 C 09/20/23 11:41  Pulse 78 09/20/23 11:41  Resp 13 09/20/23 11:41  SpO2 99 % 09/20/23 11:41    Last Pain:  Vitals:   09/20/23 1141  TempSrc: Oral  PainSc: 0-No pain      Patients Stated Pain Goal: 6 (09/20/23 1021)  Complications: No notable events documented.

## 2023-09-20 NOTE — Anesthesia Postprocedure Evaluation (Signed)
 Anesthesia Post Note  Patient: Brooke Woods  Procedure(s) Performed: COLONOSCOPY  Patient location during evaluation: Endoscopy Anesthesia Type: General Level of consciousness: awake and alert Pain management: pain level controlled Vital Signs Assessment: post-procedure vital signs reviewed and stable Respiratory status: spontaneous breathing, nonlabored ventilation and respiratory function stable Cardiovascular status: blood pressure returned to baseline and stable Postop Assessment: no apparent nausea or vomiting Anesthetic complications: no   There were no known notable events for this encounter.   Last Vitals:  Vitals:   09/20/23 1028 09/20/23 1141  BP: (!) 153/85 134/87  Pulse: 68 78  Resp:  13  Temp:  36.4 C  SpO2: 96% 99%    Last Pain:  Vitals:   09/20/23 1141  TempSrc: Oral  PainSc: 0-No pain                 Ugochukwu Chichester L Eathen Budreau

## 2023-09-20 NOTE — Anesthesia Procedure Notes (Signed)
 Date/Time: 09/20/2023 11:17 AM  Performed by: Barbarann Verneita RAMAN, CRNAPre-anesthesia Checklist: Patient identified, Emergency Drugs available, Suction available, Timeout performed and Patient being monitored Patient Re-evaluated:Patient Re-evaluated prior to induction Oxygen Delivery Method: Nasal Cannula

## 2023-09-20 NOTE — Op Note (Signed)
 [facility] Patient Name: Brooke Woods Procedure Date: 09/20/2023 11:07 AM MRN: [id number] Date of Birth: Aug 26, 1970 Attending MD: Carlin POUR. Cindie , [place], [id number] CSN: [id number] Age: 53 Admit Type: Outpatient Procedure:                Colonoscopy Indications:              Abnormal CT of the GI tract, Ileitis, history of                            small bowel obstruction Providers:                Carlin POUR. Cindie, DO, Crystal Page, Jon Loge Referring MD:              Medicines:                See the Anesthesia note for documentation of the                            administered medications Complications:            No immediate complications. Estimated Blood Loss:     Estimated blood loss was minimal. Procedure:                Pre-Anesthesia Assessment:                           - The anesthesia plan was to use monitored                            anesthesia care (MAC).                           After obtaining informed consent, the colonoscope                            was passed under direct vision. Throughout the                            procedure, the patient's blood pressure, pulse, and                            oxygen saturations were monitored continuously. The                            PCF-HQ190L ([id number]) scope was introduced through                            the anus and advanced to the the terminal ileum,                            with identification of the appendiceal orifice and                            IC valve. The colonoscopy was performed  without                            difficulty. The patient tolerated the procedure                            well. The quality of the bowel preparation was                            evaluated using the BBPS [facility] Bowel Preparation                            Scale) with scores of: Right Colon = 3, Transverse                            Colon = 3 and Left Colon = 3 (entire  mucosa seen                            well with no residual staining, small fragments of                            stool or opaque liquid). The total BBPS score                            equals 9. Scope In: 11:22:49 AM Scope Out: 11:37:41 AM Scope Withdrawal Time: 0 hours 12 minutes 1 second  Total Procedure Duration: 0 hours 14 minutes 52 seconds  Findings:      Scattered large-mouthed and small-mouthed diverticula were found in the       sigmoid colon, descending colon and ascending colon.      The terminal ileum contained six 3-6 mm ulcers. No bleeding was present.       No stigmata of recent bleeding were seen. Biopsies were taken with a       cold forceps for histology. Advanced colonoscope approximately 15 cm       into ileum without evidence of stricture.      The exam was otherwise without abnormality. Impression:               - Diverticulosis in the sigmoid colon, in the                            descending colon and in the ascending colon.                           - 5-6 ulcers in the terminal ileum. Biopsied.                           - The examination was otherwise normal. Moderate Sedation:      Per Anesthesia Care Recommendation:           - Patient has a contact number available for                            emergencies. The signs and symptoms of potential  delayed complications were discussed with the                            patient. Return to normal activities tomorrow.                            Written discharge instructions were provided to the                            patient.                           - Resume previous diet.                           - Continue present medications.                           - Await pathology results.                           - Repeat colonoscopy for surveillance based on                            pathology results.                           - Return to GI clinic in 6 weeks.                            - Further recommendations after pathology reviewed. Procedure Code(s):        --- Professional ---                           [phone], Colonoscopy, flexible; with biopsy, single                            or multiple Diagnosis Code(s):        --- Professional ---                           K63.3, Ulcer of intestine                           K57.30, Diverticulosis of large intestine without                            perforation or abscess without bleeding                           R93.3, Abnormal findings on diagnostic imaging of                            other parts of digestive tract CPT copyright 2022 American Medical Association. All rights reserved. The codes documented in this report are preliminary and upon coder review may  be revised to meet current compliance requirements. Carlin POUR. Cindie, DO Carlin POUR.  Cindie, DO 09/20/2023 11:49:11 AM This report has been signed electronically. Number of Addenda: 0

## 2023-09-20 NOTE — H&P (Signed)
 Primary Care Physician:  Job Bolt, [place] Primary Gastroenterologist:  Dr. Cindie  Pre-Procedure History & Physical: HPI:  Brooke Woods is a 53 y.o. female is here for a colonoscopy for ileitis  Past Medical History:  Diagnosis Date   Anxiety    Asthmatic bronchitis    Chronic pain    Depression    Hepatitis C    genoptype 1 a, s/p treatment with Epclusa.   Stroke [facility])    Pt reported TIA 04/27/10    Past Surgical History:  Procedure Laterality Date   CHOLECYSTECTOMY  2007   COLONOSCOPY WITH PROPOFOL  N/A 04/19/2023   Procedure: COLONOSCOPY WITH PROPOFOL ;  Surgeon: Cindie Carlin POUR, DO;  Location: [facility];  Service: Endoscopy;  Laterality: N/A;  9:30 am, asa 2   FOOT SURGERY     x3 on right and 1 on left.   KNEE SURGERY     OVARY SURGERY     right oophrectomy   POLYPECTOMY  04/19/2023   Procedure: POLYPECTOMY;  Surgeon: Cindie Carlin POUR, DO;  Location: [facility] END[facility] SUITE;  Service: Endoscopy;;   TUBAL LIGATION      Prior to Admission medications   Medication Sig Start Date End Date Taking? Authorizing Provider  lisinopril (ZESTRIL) 10 MG tablet Take 10 mg by mouth daily. 08/19/23  Yes [provider]  pantoprazole  (PROTONIX ) 40 MG tablet Take 1 tablet (40 mg total) by mouth daily. 03/17/23  Yes Mahon, Charmaine LITTIE, NP  traMADol  (ULTRAM ) 50 MG tablet Take 1 tablet (50 mg total) by mouth 2 (two) times daily as needed for moderate pain (pain score 4-6) or severe pain (pain score 7-10). 07/28/23  Yes Johnson, Clanford L, MD  ondansetron  (ZOFRAN -ODT) 4 MG disintegrating tablet Take 1 tablet (4 mg total) by mouth every 8 (eight) hours as needed for nausea or vomiting. Patient not taking: Reported on 08/18/2023 08/01/23   Kennedy Charmaine LITTIE, NP    Allergies as of 08/23/2023   (No Known Allergies)    Family History  Problem Relation Age of Onset   Hypertension Mother    Hyperlipidemia Mother    Depression Mother    Cancer Father    Hypertension Father     Hyperlipidemia Father    Cirrhosis Father        alcoholic   Hyperlipidemia Sister    Hyperlipidemia Brother    Depression Brother    Hepatitis C Brother    Colon cancer Neg Hx     Social History   Socioeconomic History   Marital status: Single    Spouse name: Not on file   Number of children: Not on file   Years of education: Not on file   Highest education level: Not on file  Occupational History   Not on file  Tobacco Use   Smoking status: Every Day    Current packs/day: 1.00    Types: Cigarettes   Smokeless tobacco: Never  Vaping Use   Vaping status: Never Used  Substance and Sexual Activity   Alcohol use: No    Comment: None in 3 years   Drug use: Yes    Types: Marijuana    Comment: pain pills previously, none since 2019.   Sexual activity: Yes    Birth control/protection: Surgical  Other Topics Concern   Not on file  Social History Narrative   Not on file   Social Drivers of Health   Financial Resource Strain: Not on file  Food Insecurity: No Food Insecurity (07/26/2023)  Hunger Vital Sign    Worried About Running Out of Food in the Last Year: Never true    Ran Out of Food in the Last Year: Never true  Transportation Needs: No Transportation Needs (07/26/2023)   PRAPARE - Administrator, Civil Service (Medical): No    Lack of Transportation (Non-Medical): No  Physical Activity: Not on file  Stress: Not on file  Social Connections: Not on file  Intimate Partner Violence: Not At Risk (07/26/2023)   Humiliation, Afraid, Rape, and Kick questionnaire    Fear of Current or Ex-Partner: No    Emotionally Abused: No    Physically Abused: No    Sexually Abused: No    Review of Systems: See HPI, otherwise negative ROS  Physical Exam: Vital signs in last 24 hours: Temp:  [97.8 F (36.6 C)] 97.8 F (36.6 C) (08/12 1021) Pulse Rate:  [68-72] 68 (08/12 1028) Resp:  [12] 12 (08/12 1021) BP: (129-153)/(85-101) 153/85 (08/12 1028) SpO2:  [96 %-97  %] 96 % (08/12 1028) Weight:  [50.8 kg] 50.8 kg (08/12 1021)   General:   Alert,  Well-developed, well-nourished, pleasant and cooperative in NAD Head:  Normocephalic and atraumatic. Eyes:  Sclera clear, no icterus.   Conjunctiva pink. Ears:  Normal auditory acuity. Nose:  No deformity, discharge,  or lesions. Msk:  Symmetrical without gross deformities. Normal posture. Extremities:  Without clubbing or edema. Neurologic:  Alert and  oriented x4;  grossly normal neurologically. Skin:  Intact without significant lesions or rashes. Psych:  Alert and cooperative. Normal mood and affect.  Impression/Plan: Brooke Woods is here for a colonoscopy to be performed for ileitis.  The risks of the procedure including infection, bleed, or perforation as well as benefits, limitations, alternatives and imponderables have been reviewed with the patient. Questions have been answered. All parties agreeable.

## 2023-09-20 NOTE — Anesthesia Preprocedure Evaluation (Signed)
 Anesthesia Evaluation  Patient identified by MRN, date of birth, ID band Patient awake    Reviewed: Allergy & Precautions, H&P , NPO status , Patient's Chart, lab work & pertinent test results, reviewed documented beta blocker date and time   Airway Mallampati: II  TM Distance: >3 FB Neck ROM: full    Dental no notable dental hx. (+) Dental Advisory Given, Teeth Intact   Pulmonary asthma , Current Smoker   Pulmonary exam normal breath sounds clear to auscultation       Cardiovascular Exercise Tolerance: Good hypertension, Normal cardiovascular exam Rhythm:regular Rate:Normal     Neuro/Psych  PSYCHIATRIC DISORDERS Anxiety Depression    TIACVA    GI/Hepatic ,GERD  ,,(+) Hepatitis -, C  Endo/Other  negative endocrine ROS    Renal/GU negative Renal ROS  negative genitourinary   Musculoskeletal   Abdominal   Peds  Hematology negative hematology ROS (+)   Anesthesia Other Findings   Reproductive/Obstetrics negative OB ROS                              Anesthesia Physical Anesthesia Plan  ASA: 3  Anesthesia Plan: General   Post-op Pain Management: Minimal or no pain anticipated   Induction: Intravenous  PONV Risk Score and Plan: Propofol  infusion  Airway Management Planned: Natural Airway and Nasal Cannula  Additional Equipment: None  Intra-op Plan:   Post-operative Plan:   Informed Consent: I have reviewed the patients History and Physical, chart, labs and discussed the procedure including the risks, benefits and alternatives for the proposed anesthesia with the patient or authorized representative who has indicated his/her understanding and acceptance.     Dental Advisory Given  Plan Discussed with: CRNA  Anesthesia Plan Comments:          Anesthesia Quick Evaluation

## 2023-09-21 ENCOUNTER — Encounter (HOSPITAL_COMMUNITY): Payer: Self-pay | Admitting: Internal Medicine

## 2023-09-21 DIAGNOSIS — F17210 Nicotine dependence, cigarettes, uncomplicated: Secondary | ICD-10-CM | POA: Diagnosis not present

## 2023-09-21 DIAGNOSIS — M79671 Pain in right foot: Secondary | ICD-10-CM | POA: Diagnosis not present

## 2023-09-21 DIAGNOSIS — R768 Other specified abnormal immunological findings in serum: Secondary | ICD-10-CM | POA: Diagnosis not present

## 2023-09-21 DIAGNOSIS — E559 Vitamin D deficiency, unspecified: Secondary | ICD-10-CM | POA: Diagnosis not present

## 2023-09-21 DIAGNOSIS — R7303 Prediabetes: Secondary | ICD-10-CM | POA: Diagnosis not present

## 2023-09-21 DIAGNOSIS — Z0000 Encounter for general adult medical examination without abnormal findings: Secondary | ICD-10-CM | POA: Diagnosis not present

## 2023-09-21 DIAGNOSIS — M549 Dorsalgia, unspecified: Secondary | ICD-10-CM | POA: Diagnosis not present

## 2023-09-21 DIAGNOSIS — M79672 Pain in left foot: Secondary | ICD-10-CM | POA: Diagnosis not present

## 2023-09-21 DIAGNOSIS — Z79899 Other long term (current) drug therapy: Secondary | ICD-10-CM | POA: Diagnosis not present

## 2023-09-21 LAB — SURGICAL PATHOLOGY

## 2023-09-22 DIAGNOSIS — M79672 Pain in left foot: Secondary | ICD-10-CM | POA: Diagnosis not present

## 2023-09-22 DIAGNOSIS — R0602 Shortness of breath: Secondary | ICD-10-CM | POA: Diagnosis not present

## 2023-09-22 DIAGNOSIS — M79671 Pain in right foot: Secondary | ICD-10-CM | POA: Diagnosis not present

## 2023-09-22 DIAGNOSIS — M549 Dorsalgia, unspecified: Secondary | ICD-10-CM | POA: Diagnosis not present

## 2023-09-23 DIAGNOSIS — Z79899 Other long term (current) drug therapy: Secondary | ICD-10-CM | POA: Diagnosis not present

## 2023-09-27 DIAGNOSIS — F17210 Nicotine dependence, cigarettes, uncomplicated: Secondary | ICD-10-CM | POA: Diagnosis not present

## 2023-09-27 DIAGNOSIS — M51362 Other intervertebral disc degeneration, lumbar region with discogenic back pain and lower extremity pain: Secondary | ICD-10-CM | POA: Diagnosis not present

## 2023-09-27 DIAGNOSIS — R7303 Prediabetes: Secondary | ICD-10-CM | POA: Diagnosis not present

## 2023-09-27 DIAGNOSIS — R768 Other specified abnormal immunological findings in serum: Secondary | ICD-10-CM | POA: Diagnosis not present

## 2023-09-27 DIAGNOSIS — M5030 Other cervical disc degeneration, unspecified cervical region: Secondary | ICD-10-CM | POA: Diagnosis not present

## 2023-09-27 DIAGNOSIS — Z79899 Other long term (current) drug therapy: Secondary | ICD-10-CM | POA: Diagnosis not present

## 2023-09-27 DIAGNOSIS — E785 Hyperlipidemia, unspecified: Secondary | ICD-10-CM | POA: Diagnosis not present

## 2023-09-27 DIAGNOSIS — M21611 Bunion of right foot: Secondary | ICD-10-CM | POA: Diagnosis not present

## 2023-09-27 DIAGNOSIS — I10 Essential (primary) hypertension: Secondary | ICD-10-CM | POA: Diagnosis not present

## 2023-09-27 DIAGNOSIS — E559 Vitamin D deficiency, unspecified: Secondary | ICD-10-CM | POA: Diagnosis not present

## 2023-10-03 DIAGNOSIS — Z79899 Other long term (current) drug therapy: Secondary | ICD-10-CM | POA: Diagnosis not present

## 2023-10-04 DIAGNOSIS — E559 Vitamin D deficiency, unspecified: Secondary | ICD-10-CM | POA: Diagnosis not present

## 2023-10-04 DIAGNOSIS — F17210 Nicotine dependence, cigarettes, uncomplicated: Secondary | ICD-10-CM | POA: Diagnosis not present

## 2023-10-04 DIAGNOSIS — Z79899 Other long term (current) drug therapy: Secondary | ICD-10-CM | POA: Diagnosis not present

## 2023-10-04 DIAGNOSIS — R768 Other specified abnormal immunological findings in serum: Secondary | ICD-10-CM | POA: Diagnosis not present

## 2023-10-04 DIAGNOSIS — Z9181 History of falling: Secondary | ICD-10-CM | POA: Diagnosis not present

## 2023-10-04 DIAGNOSIS — E785 Hyperlipidemia, unspecified: Secondary | ICD-10-CM | POA: Diagnosis not present

## 2023-10-04 DIAGNOSIS — M5030 Other cervical disc degeneration, unspecified cervical region: Secondary | ICD-10-CM | POA: Diagnosis not present

## 2023-10-04 DIAGNOSIS — I10 Essential (primary) hypertension: Secondary | ICD-10-CM | POA: Diagnosis not present

## 2023-10-04 DIAGNOSIS — R7303 Prediabetes: Secondary | ICD-10-CM | POA: Diagnosis not present

## 2023-10-04 DIAGNOSIS — M51362 Other intervertebral disc degeneration, lumbar region with discogenic back pain and lower extremity pain: Secondary | ICD-10-CM | POA: Diagnosis not present

## 2023-10-04 DIAGNOSIS — M21611 Bunion of right foot: Secondary | ICD-10-CM | POA: Diagnosis not present

## 2023-10-05 ENCOUNTER — Telehealth: Payer: Self-pay | Admitting: *Deleted

## 2023-10-05 ENCOUNTER — Other Ambulatory Visit: Payer: Self-pay | Admitting: Gastroenterology

## 2023-10-05 DIAGNOSIS — K219 Gastro-esophageal reflux disease without esophagitis: Secondary | ICD-10-CM

## 2023-10-05 MED ORDER — PANTOPRAZOLE SODIUM 40 MG PO TBEC: 40.0000 mg | DELAYED_RELEASE_TABLET | Freq: Every day | ORAL | 3 refills | Status: AC

## 2023-10-05 NOTE — Telephone Encounter (Signed)
 Received refill request for pantoprazole . Pt last OV 08/18/2023

## 2023-10-05 NOTE — Telephone Encounter (Signed)
 Rx sent.

## 2023-10-06 ENCOUNTER — Encounter: Admitting: Obstetrics & Gynecology

## 2023-10-11 ENCOUNTER — Ambulatory Visit: Payer: Self-pay | Admitting: Internal Medicine

## 2023-10-11 DIAGNOSIS — L84 Corns and callosities: Secondary | ICD-10-CM | POA: Diagnosis not present

## 2023-10-11 DIAGNOSIS — M2021 Hallux rigidus, right foot: Secondary | ICD-10-CM | POA: Diagnosis not present

## 2023-10-11 DIAGNOSIS — D2371 Other benign neoplasm of skin of right lower limb, including hip: Secondary | ICD-10-CM | POA: Diagnosis not present

## 2023-10-11 DIAGNOSIS — Z79899 Other long term (current) drug therapy: Secondary | ICD-10-CM | POA: Diagnosis not present

## 2023-10-11 DIAGNOSIS — M21861 Other specified acquired deformities of right lower leg: Secondary | ICD-10-CM | POA: Diagnosis not present

## 2023-10-11 DIAGNOSIS — Z9889 Other specified postprocedural states: Secondary | ICD-10-CM | POA: Diagnosis not present

## 2023-10-11 DIAGNOSIS — D2372 Other benign neoplasm of skin of left lower limb, including hip: Secondary | ICD-10-CM | POA: Diagnosis not present

## 2023-10-11 DIAGNOSIS — Q828 Other specified congenital malformations of skin: Secondary | ICD-10-CM | POA: Diagnosis not present

## 2023-10-11 DIAGNOSIS — M2022 Hallux rigidus, left foot: Secondary | ICD-10-CM | POA: Diagnosis not present

## 2023-10-11 DIAGNOSIS — M21862 Other specified acquired deformities of left lower leg: Secondary | ICD-10-CM | POA: Diagnosis not present

## 2023-10-25 ENCOUNTER — Telehealth: Payer: Self-pay | Admitting: *Deleted

## 2023-10-25 ENCOUNTER — Other Ambulatory Visit: Payer: Self-pay | Admitting: Gastroenterology

## 2023-10-25 DIAGNOSIS — K219 Gastro-esophageal reflux disease without esophagitis: Secondary | ICD-10-CM

## 2023-10-25 NOTE — Telephone Encounter (Signed)
 Noted. I called the pharmacy. Yes she does have refills on file. Notified pt.

## 2023-10-25 NOTE — Telephone Encounter (Signed)
 Received medication refill request for pantoprazole . Pt last OV 08/18/2023

## 2023-10-31 NOTE — Progress Notes (Unsigned)
 GI Office Note    Referring Provider: Job Bolt, [place] Primary Care Physician:  Job Bolt, [place] Primary Gastroenterologist: Carlin POUR. Cindie, DO  Date:  11/01/2023  ID:  Brooke Woods, DOB 05/21/1970, MRN [id number]   Chief Complaint   Chief Complaint  Patient presents with   Follow-up    Patient here today for a follow up from recent Tcs done by Dr. Cindie on 09/20/2023. Patient denies any current gi related issues. Thalia is controlled on Pantoprazole  40 mg once per day.   History of Present Illness  Brooke Woods is a 53 y.o. 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   OV in April 2023.  Reportedly was doing well.  Happy that she completed her hepatitis C treatment with Epclusa.  She denies any abdominal pain, constipation, diarrhea, BRBPR, melena, weight loss, nausea or vomiting.  She did report struggling with heartburn that started returning age 83 and reported it was becoming more frequent, occurring at least 3 days a week.  He was not eating spicy or a lot of fried/greasy foods.  Did admit to drinking Dr. Nunzio daily.  Declined colonoscopy, reports she had Cologuard at home. Advised repeat HCVRNA in July 2023, advised to complete hepatitis a and B vaccinations.  She was started on Protonix  20 mg daily 30 minutes prior to breakfast.  Advised to complete Cologuard.  Diet and exercise reinforced.   ED visit for nausea vomiting at [facility] on 09/30/2021.  She had some leukocytosis, potassium 2.9, lipase 495.  CT A/P with collapsed colon, appeared to show mild wall thickening/edema suggesting mild colitis, previous evidence of cholecystectomy.  She was treated with antiemetics, IV fluids, Cipro  and Flagyl for 10 days.  She was recommended  to have colonoscopy in 6 weeks follow-up with PCP in 1-2 weeks.   OV 11/12/21.  Follow with pain clinic as she was getting Suboxone.  Given her chronic pain she has been to the ED and was to have a new pain doctor soon.  Reports she took all of her antibiotics from her ED visit and has some postprandial urgency for a few weeks.  Not currently having any abdominal pain, was previously in the lower abdomen.  Nausea and vomiting resolved.  Reported 20 pound weight loss since stopped taking methadone.  She denied issues with colitis in the past.  Never had a colonoscopy. Advised to continue pantoprazole  20 mg once daily.  HCVRNA.  Colonoscopy ordered.   HCVRNA not detected in October 2023.   Patient eventually canceled procedure and did not call to reschedule.   OV 03/17/23.  Reported mild intermittent stomach pains, sometimes daily and then may happen once a week.  Oranges can cause burning for 2 days and at times will have sharp gas pains.  Denied odynophagia or dysphagia.  Denies any rectal bleeding.  Bowel movements every other day and usually soft formed stool and not having to strain.  Appetite at baseline.  She reported an abnormal Cologuard.  She reported her weight gain in October 2023 was an anomaly for her.  She reports being thin all her life.  Advise continue to monitor weight at home.  Start pantoprazole  40 mg once daily.  Labs ordered as well as colonoscopy.   Colonoscopy 04/19/2023: - Diverticulosis in the  entire examined colon.  - One 5 mm polyp in the distal rectum, removed with a cold snare - Six 3 to 6 mm polyps in the proximal rectum and in the sigmoid colon - 2 ulcers in the terminal ileum. Biopsied. - Inflammation noted in the TI.  This was possibly medication induced versus resolving infection (NSAIDs).  Polyps revealed to be tubular, and hyperplastic. -Repeat colonoscopy in 7 years  Last office visit with me 05/23/23.  Only having occasional pain in the left upper quadrant and  thought maybe it was just from forgetting her reflux medication.  This happens once every couple weeks.  Has excellent appetite.  Usually wings and or sauces bother her and worsen her reflux.  Denied any nausea, vomiting, dysphagia, early satiety.  Also no issues with constipation or diarrhea, melena, or BRBPR.  No longer taking Suboxone. Advised pantoprazole  40 mg once daily. Famotidine 20 mg as needed. Tums as needed. GERD diet. Annual LFTs. Colonoscopy 2032.   CT A/P with contrast June 2025 IMPRESSION: 1. Distal small bowel in the pelvis is distended up to 2.7 cm diameter with fecalization of enteric contents suggesting decreased transit. Mesentery of the dilated bowel is congested/edematous. This most abnormal loop of small bowel tracks directly into a short segment of luminal narrowing secondary to circumferential wall thickening and enhancement along the left pelvic sidewall. Small bowel in the right pelvis appears more decompressed although fluid and gas is visible in the terminal ileum. Imaging features suggest small bowel obstruction potentially due to inflammatory stricture although neoplastic etiology is not excluded. Findings could also reflect mechanical obstruction due to adhesion with secondary wall thickening and hyperenhancement in the small bowel. 2. Trace free fluid in the pelvis. 3. Status post cholecystectomy with dilatation of the common bile duct up to about 11 mm diameter in the head of the pancreas with mild intrahepatic biliary duct prominence. Correlation with liver function tests recommended. 4. Calcified uterine fibroid.  MR abdomen/MRCP 07/26/23 IMPRESSION: 1. Chronic biliary dilatation up to 1.1 cm in the common hepatic duct and 0.8 cm in the common bile duct, without filling defect identified in the biliary tree, subject to limitations from motion artifact. The degree of extrahepatic biliary dilatation is mildly reduced compared to 01/06/2021. This probably represents chronic  physiologic dilatation of the extrahepatic biliary tree in response to cholecystectomy.  Last office visit in the clinic 08/18/23 with Josette Centers, PA.  Doing very well overall.  Denies any abdominal pain.  Bowels moving well without melena.  No burning in her abdomen which she was facing prior.  Has continued on prednisone  daily without heartburn.  Blood pressure elevated during this visit.  Trying to eat healthier and be more active.  Given prior CT scan with evidence of small bowel obstruction due to possible stricture she had been started on empiric steroids and states she is doing very well with resolution of abdominal pain.  Bowels moving well.  Has had normal CRP in the past. Advised to complete prednisone  taper and plans to discuss case with Dr. Cindie. Advised BP check at home.   Further conversation with Dr. Cindie plan was for colonoscopy to reevaluate small bowel after prednisone  course.   Colonoscopy 09/20/23: - Diverticulosis in the sigmoid colon, in the descending colon and in the ascending colon.  - 5- 6 ulcers in the terminal ileum. Biopsied.  - The examination was otherwise normal. - Path: focal mild activity and architectural distortion of small intestine. Findings non specific but could be  secondary to NSAIDs as well as IBD. Dr. Cindie suspected that givne chronic findings the proposed diagnosis is chron's ileitis. Recommended discussing treatment options at follow up.    Today:  Discussed the use of AI scribe software for clinical note transcription with the patient, who gave verbal consent to proceed.  Her abdominal pain has improved and has not returned since her last visit. She has switched from NSAIDs to Tylenol  for pain management. She reported improvement in her pain and symptoms while taking prednisone . Her bowel movements are regular, occurring once a day, with occasional loose stools noted yesterday. No abdominal pain, nausea, vomiting, trouble swallowing, blood in  stool, mucus in stool, black stool, or rectal pain. She experiences sharp stomach pain if she forgets to take her pantoprazole , which she takes once daily for reflux. Weight stable. No weight loss. No reports of chest pain or shortness of breath.  She smokes a pack of cigarettes a day and has previously attempted to quit using nicotine  patches, which was successful for two weeks. She wants to try the nicotine  patch again to aid in smoking cessation.       Wt Readings from Last 5 Encounters:  11/01/23 113 lb 1.6 oz (51.3 kg)  09/20/23 112 lb (50.8 kg)  08/18/23 110 lb 6.4 oz (50.1 kg)  07/26/23 110 lb 7.2 oz (50.1 kg)  05/23/23 114 lb 6.4 oz (51.9 kg)    Current Outpatient Medications  Medication Sig Dispense Refill   cyclobenzaprine  (FLEXERIL ) 5 MG tablet Take 5 mg by mouth 3 (three) times daily.     lisinopril (ZESTRIL) 10 MG tablet Take 10 mg by mouth daily.     Oxycodone  HCl 10 MG TABS Take 10 mg by mouth 3 (three) times daily.     pantoprazole  (PROTONIX ) 40 MG tablet Take 1 tablet (40 mg total) by mouth daily. 90 tablet 3   No current facility-administered medications for this visit.    Past Medical History:  Diagnosis Date   Anxiety    Asthmatic bronchitis    Chronic pain    Depression    Hepatitis C    genoptype 1 a, s/p treatment with Epclusa.   Stroke [facility])    Pt reported TIA 04/27/10    Past Surgical History:  Procedure Laterality Date   CHOLECYSTECTOMY  2007   COLONOSCOPY N/A 09/20/2023   Procedure: COLONOSCOPY;  Surgeon: Cindie Carlin POUR, DO;  Location: [facility];  Service: Endoscopy;  Laterality: N/A;  11:00am, asa 2, pt knows to arrive at 10:00   COLONOSCOPY WITH PROPOFOL  N/A 04/19/2023   Procedure: COLONOSCOPY WITH PROPOFOL ;  Surgeon: Cindie Carlin POUR, DO;  Location: [facility];  Service: Endoscopy;  Laterality: N/A;  9:30 am, asa 2   FOOT SURGERY     x3 on right and 1 on left.   KNEE SURGERY     OVARY SURGERY     right oophrectomy   POLYPECTOMY   04/19/2023   Procedure: POLYPECTOMY;  Surgeon: Cindie Carlin POUR, DO;  Location: [facility];  Service: Endoscopy;;   TUBAL LIGATION      Family History  Problem Relation Age of Onset   Hypertension Mother    Hyperlipidemia Mother    Depression Mother    Cancer Father    Hypertension Father    Hyperlipidemia Father    Cirrhosis Father        alcoholic   Hyperlipidemia Sister    Hyperlipidemia Brother    Depression Brother  Hepatitis C Brother    Colon cancer Neg Hx     Allergies as of 11/01/2023   (No Known Allergies)    Social History   Socioeconomic History   Marital status: Single    Spouse name: Not on file   Number of children: Not on file   Years of education: Not on file   Highest education level: Not on file  Occupational History   Not on file  Tobacco Use   Smoking status: Every Day    Current packs/day: 1.00    Types: Cigarettes   Smokeless tobacco: Never  Vaping Use   Vaping status: Never Used  Substance and Sexual Activity   Alcohol use: No    Comment: None in 3 years   Drug use: Not Currently    Types: Marijuana    Comment: pain pills previously, none since 2019.   Sexual activity: Yes    Birth control/protection: Surgical  Other Topics Concern   Not on file  Social History Narrative   Not on file   Social Drivers of Health   Financial Resource Strain: Not on file  Food Insecurity: No Food Insecurity (07/26/2023)   Hunger Vital Sign    Worried About Running Out of Food in the Last Year: Never true    Ran Out of Food in the Last Year: Never true  Transportation Needs: No Transportation Needs (07/26/2023)   PRAPARE - Administrator, Civil Service (Medical): No    Lack of Transportation (Non-Medical): No  Physical Activity: Not on file  Stress: Not on file  Social Connections: Not on file     Review of Systems   Gen: Denies fever, chills, anorexia. Denies fatigue, weakness, weight loss.  CV: Denies chest pain,  palpitations, syncope, peripheral edema, and claudication. Resp: Denies dyspnea at rest, cough, wheezing, coughing up blood, and pleurisy. GI: See HPI Derm: Denies rash, itching, dry skin Psych: Denies depression, anxiety, memory loss, confusion. No homicidal or suicidal ideation.  Heme: Denies bruising, bleeding, and enlarged lymph nodes.  Physical Exam   BP (!) 150/94 (BP Location: Right Arm, Patient Position: Sitting, Cuff Size: Normal)   Pulse 81   Temp 97.8 F (36.6 C) (Temporal)   Ht 5' 7 (1.702 m)   Wt 113 lb 1.6 oz (51.3 kg)   LMP 05/26/2016   BMI 17.71 kg/m   General:   Alert and oriented. No distress noted. Pleasant and cooperative.  Head:  Normocephalic and atraumatic. Eyes:  Conjuctiva clear without scleral icterus. Mouth:  Oral mucosa pink and moist. Good dentition. No lesions. Abdomen:  +BS, soft, non-tender and non-distended. No rebound or guarding. No HSM or masses noted. Rectal: deferred Msk:  Symmetrical without gross deformities. Normal posture. Extremities:  Without edema. Neurologic:  Alert and  oriented x4 Psych:  Alert and cooperative. Normal mood and affect.  Assessment & Plan  Brooke Woods is a 53 y.o. female presenting today to discuss treatment for presumed ileal Crohn's.     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 Prednisone  provides symptomatic relief, indicating an inflammatory component. Differential diagnosis includes NSAID-induced bowel obstruction, but Crohn's disease is more likely given chronic nature of colonoscopy and pathology findings and imaging. The goal of therapy is to reduce inflammation and heal mucosal lining to prevent recurrent bowel obstructions and reduce cancer risk. Has had negative TB gold tests and  negative Hep B surface antigen as well as normal CRP in June 2025. Time spent providing  education regarding disease pathophysiology and  - Recommend biologic therapy for treatment, focusing on anti-TNF agents like infliximab (Remicade) or adalimumab (Humira) given stricturing nature of disease with prior SBO. Shared decision-making led to a preference for Humira due to ease of home administration. Discussed risks including increased infection risk, potential for reduced platelet and white blood cell counts, and side effects like abdominal pain, diarrhea, or constipation. Will submit after receiving lab results. - Order blood work to rule out hepatitis B, and check immunity for varicella, Hep B, Hep A. - Order liver and kidney function tests and check for vitamin deficiencies and anemia.  - Discuss and recommend vaccinations including shingles, pneumonia, flu, and COVID vaccines. - Consider DEXA scan for bone density monitoring - ordered today. - Plan follow-up colonoscopy in 6-12 months to assess healing of ulcers.  GERD Currently well controlled with pantoprazole  40 mg once daily. No alarm symptoms present.  - Continue current pantoprazole  regimen.   Tobacco use disorder Continued tobacco use at one pack per day. Smoking increases the risk of complications and may exacerbate Crohn's disease. Discussed potential benefits of smoking cessation and use of nicotine  patches as a cessation aid. She expressed interest in nicotine  patches, but potential interactions with Crohn's medication need to be checked. - Check for interactions between nicotine  patches and Crohn's medication. - Consider prescribing nicotine  patches if no interactions are found.      Follow up   Follow up to be determined after receiving the rest of her labs.   Will submit for Humira per her request once labs received.     Charmaine Melia, MSN, FNP-BC, AGACNP-BC [facility]

## 2023-11-01 ENCOUNTER — Encounter: Payer: Self-pay | Admitting: Gastroenterology

## 2023-11-01 ENCOUNTER — Ambulatory Visit (INDEPENDENT_AMBULATORY_CARE_PROVIDER_SITE_OTHER): Admitting: Gastroenterology

## 2023-11-01 VITALS — BP 150/94 | HR 81 | Temp 97.8°F | Ht 67.0 in | Wt 113.1 lb

## 2023-11-01 DIAGNOSIS — K56609 Unspecified intestinal obstruction, unspecified as to partial versus complete obstruction: Secondary | ICD-10-CM | POA: Diagnosis not present

## 2023-11-01 DIAGNOSIS — K50012 Crohn's disease of small intestine with intestinal obstruction: Secondary | ICD-10-CM | POA: Diagnosis not present

## 2023-11-01 DIAGNOSIS — F17210 Nicotine dependence, cigarettes, uncomplicated: Secondary | ICD-10-CM

## 2023-11-01 DIAGNOSIS — K56699 Other intestinal obstruction unspecified as to partial versus complete obstruction: Secondary | ICD-10-CM | POA: Diagnosis not present

## 2023-11-01 DIAGNOSIS — Z716 Tobacco abuse counseling: Secondary | ICD-10-CM

## 2023-11-01 DIAGNOSIS — K50019 Crohn's disease of small intestine with unspecified complications: Secondary | ICD-10-CM

## 2023-11-01 DIAGNOSIS — K219 Gastro-esophageal reflux disease without esophagitis: Secondary | ICD-10-CM

## 2023-11-01 NOTE — Patient Instructions (Addendum)
 We will start the workup process to submit for Humira for you after I receive your lab work if everything is stable then I will work on setting you up with the drug company itself for assistance and figure out which probably is the best to submit for the medication.  Recommended vaccinations according to guidelines: Pneumococcal vaccine 21 Shingles vaccination - 2 doses one month apart from each other.  COVID vaccination Annual flu vaccination RSV vaccination Updated Tdap Recommended that you have had meningococcal and hepatitis A vaccination as an adult If you are not immune to hepatitis B based off your labs we will recommend this vaccination as well.  Recommended cancer prevention: - Initial skin check with a dermatologist and annual thereafter - Cervical cancer screening annually  Other screening/health maintenance: - DEXA scan now and then periodically thereafter - Work toward smoking cessation -I will look into the ability for us  to do nicotine  patches to help you quit smoking.  For more information about Crohn's and dietary recommendations, I recommend that you go to the Crohn's and colitis foundation website.  There is lots of good accurate information on there for you.  Follow-up timeframe will be dependent on labs and when we start you on medication.   It was a pleasure to see you today. I want to create trusting relationships with patients. If you receive a survey regarding your visit,  I greatly appreciate you taking time to fill this out on paper or through your MyChart. I value your feedback.  Charmaine Melia, MSN, FNP-BC, AGACNP-BC [facility]

## 2023-11-02 DIAGNOSIS — Z79899 Other long term (current) drug therapy: Secondary | ICD-10-CM | POA: Diagnosis not present

## 2023-11-02 DIAGNOSIS — F17210 Nicotine dependence, cigarettes, uncomplicated: Secondary | ICD-10-CM | POA: Diagnosis not present

## 2023-11-02 DIAGNOSIS — E785 Hyperlipidemia, unspecified: Secondary | ICD-10-CM | POA: Diagnosis not present

## 2023-11-02 DIAGNOSIS — E559 Vitamin D deficiency, unspecified: Secondary | ICD-10-CM | POA: Diagnosis not present

## 2023-11-02 DIAGNOSIS — I10 Essential (primary) hypertension: Secondary | ICD-10-CM | POA: Diagnosis not present

## 2023-11-02 DIAGNOSIS — Z9181 History of falling: Secondary | ICD-10-CM | POA: Diagnosis not present

## 2023-11-02 DIAGNOSIS — M5030 Other cervical disc degeneration, unspecified cervical region: Secondary | ICD-10-CM | POA: Diagnosis not present

## 2023-11-02 DIAGNOSIS — R7303 Prediabetes: Secondary | ICD-10-CM | POA: Diagnosis not present

## 2023-11-02 DIAGNOSIS — M51362 Other intervertebral disc degeneration, lumbar region with discogenic back pain and lower extremity pain: Secondary | ICD-10-CM | POA: Diagnosis not present

## 2023-11-02 DIAGNOSIS — M21611 Bunion of right foot: Secondary | ICD-10-CM | POA: Diagnosis not present

## 2023-11-08 ENCOUNTER — Ambulatory Visit: Payer: Self-pay | Admitting: Gastroenterology

## 2023-11-08 ENCOUNTER — Ambulatory Visit (HOSPITAL_COMMUNITY)
Admission: RE | Admit: 2023-11-08 | Discharge: 2023-11-08 | Disposition: A | Discharge status: Home / Self Care | Source: Ambulatory Visit | Attending: Gastroenterology | Admitting: Gastroenterology

## 2023-11-08 DIAGNOSIS — K50012 Crohn's disease of small intestine with intestinal obstruction: Secondary | ICD-10-CM | POA: Insufficient documentation

## 2023-11-08 DIAGNOSIS — Z13820 Encounter for screening for osteoporosis: Secondary | ICD-10-CM | POA: Diagnosis not present

## 2023-11-08 DIAGNOSIS — Z7952 Long term (current) use of systemic steroids: Secondary | ICD-10-CM | POA: Diagnosis present

## 2023-11-08 DIAGNOSIS — Z780 Asymptomatic menopausal state: Secondary | ICD-10-CM | POA: Diagnosis not present

## 2023-11-08 DIAGNOSIS — M8589 Other specified disorders of bone density and structure, multiple sites: Secondary | ICD-10-CM | POA: Insufficient documentation

## 2023-11-08 DIAGNOSIS — M85852 Other specified disorders of bone density and structure, left thigh: Secondary | ICD-10-CM | POA: Diagnosis not present

## 2023-11-08 DIAGNOSIS — M85851 Other specified disorders of bone density and structure, right thigh: Secondary | ICD-10-CM | POA: Diagnosis not present

## 2023-11-09 LAB — CBC
HCT: 41.9 % (ref 35.0–45.0)
Hemoglobin: 13.5 g/dL (ref 11.7–15.5)
MCH: 29.6 pg (ref 27.0–33.0)
MCHC: 32.2 g/dL (ref 32.0–36.0)
MCV: 91.9 fL (ref 80.0–100.0)
MPV: 11.6 fL (ref 7.5–12.5)
Platelets: 242 Thousand/uL (ref 140–400)
RBC: 4.56 Million/uL (ref 3.80–5.10)
RDW: 13 % (ref 11.0–15.0)
WBC: 9.1 Thousand/uL (ref 3.8–10.8)

## 2023-11-09 LAB — COMPREHENSIVE METABOLIC PANEL WITH GFR
AG Ratio: 1.4 (calc) (ref 1.0–2.5)
ALT: 10 U/L (ref 6–29)
AST: 15 U/L (ref 10–35)
Albumin: 4 g/dL (ref 3.6–5.1)
Alkaline phosphatase (APISO): 96 U/L (ref 37–153)
BUN: 13 mg/dL (ref 7–25)
CO2: 28 mmol/L (ref 20–32)
Calcium: 9.7 mg/dL (ref 8.6–10.4)
Chloride: 102 mmol/L (ref 98–110)
Creat: 0.78 mg/dL (ref 0.50–1.03)
Globulin: 2.9 g/dL (ref 1.9–3.7)
Glucose, Bld: 95 mg/dL (ref 65–99)
Potassium: 4.1 mmol/L (ref 3.5–5.3)
Sodium: 136 mmol/L (ref 135–146)
Total Bilirubin: 0.2 mg/dL (ref 0.2–1.2)
Total Protein: 6.9 g/dL (ref 6.1–8.1)
eGFR: 91 mL/min/1.73m2 (ref >=60)

## 2023-11-09 LAB — HEPATITIS B SURFACE ANTIBODY,QUALITATIVE: Hep B S Ab: NONREACTIVE

## 2023-11-09 LAB — B12 AND FOLATE PANEL
Folate: 8.3 ng/mL
Vitamin B-12: 421 pg/mL (ref 200–1100)

## 2023-11-09 LAB — PHOSPHORUS: Phosphorus: 4.4 mg/dL (ref 2.5–4.5)

## 2023-11-09 LAB — HEPATITIS A ANTIBODY, TOTAL: Hepatitis A AB,Total: NONREACTIVE

## 2023-11-09 LAB — HEPATITIS B CORE ANTIBODY, IGM: Hep B C IgM: NONREACTIVE

## 2023-11-09 LAB — HEPATITIS A ANTIBODY, IGM: Hep A IgM: NONREACTIVE

## 2023-11-09 LAB — MAGNESIUM: Magnesium: 2 mg/dL (ref 1.5–2.5)

## 2023-11-09 LAB — VITAMIN D 25 HYDROXY (VIT D DEFICIENCY, FRACTURES): Vit D, 25-Hydroxy: 35 ng/mL (ref 30–100)

## 2023-11-09 NOTE — Progress Notes (Signed)
 noted

## 2023-11-11 NOTE — Progress Notes (Deleted)
  Cardiology Office Note   Date:  11/11/2023  ID:  Brooke Woods, DOB 1970-12-05, MRN [id number] PCP: Job Bolt, PA  [facility] Providers Cardiologist:  Newman JINNY Lawrence, MD Cardiology APP:  Vicci Rollo SAUNDERS, PA-C    History of Present Illness Brooke Woods is a 53 y.o. female with past medical history of GERD, chronic hepatitis C, recent SBO in 07/2023 that was managed medically, chronic pain. Patient presents today for a follow up appointment   Patient was recently admitted to [facility] 6/17 - 6/19.  She had presented complaining of epigastric abdominal pain, nausea, vomiting.  CT scan showed findings consistent with SBO.  Given IV fluids, Zofran , Dilaudid .  Had a bowel movement 6/18.  Able to tolerate diet and was discharged home.   Patient was seen by PCP after discharge and reported having some left chest tightness.  She felt this may have been related to her left breast.  She had an EKG that was reportedly normal and was referred to cardiology. I saw her in clinic on 7/7. She told me that her chest pain was random and she had not had any in >1 month. She thought it had been breast pain. Ordered coronary calcium scoring that showed ***. Ordered echocardiogram that showed EF 60-65%, no regional wall motion abnormalities, normal RV systolic function, aortic sclerosis without stenosis.   Abnormal EKG  Heart Murmur  - Patient was referred by PCP for abnormal EKG and heart murmur.  EKG today shows sinus rhythm, incomplete right bundle branch block.  Incomplete RBBB has been present on EKG since 2016 - Echocardiogram 7/25 showed EF 60-65%, no regional wall motion abnormalities, normal RV systolic function, aortic sclerosis without stenosis  -    Chest pain  - Patient reports that she use to have episodes of random left-sided chest tightness.  She thought that this may be breast discomfort.  Not exertional. - She has not had any chest tightness in over a month.   She stays active by walking a mile with her grandchildren.  She does not have chest pain or dyspnea on exertion -  EKG without ischemic changes - Echo with EF 60-65%, no wall motion abnormalities  - CT calcium scoring ***   Tobacco Use  - Counseled on tobacco cessation  ROS: ***  Studies Reviewed      *** Risk Assessment/Calculations {Does this patient have ATRIAL FIBRILLATION?[phone]} No BP recorded.  {Refresh Note OR Click here to enter BP  :1}***       Physical Exam VS:  LMP 05/26/2016        Wt Readings from Last 3 Encounters:  11/01/23 113 lb 1.6 oz (51.3 kg)  09/20/23 112 lb (50.8 kg)  08/18/23 110 lb 6.4 oz (50.1 kg)    GEN: Well nourished, well developed in no acute distress NECK: No JVD; No carotid bruits CARDIAC: ***RRR, no murmurs, rubs, gallops RESPIRATORY:  Clear to auscultation without rales, wheezing or rhonchi  ABDOMEN: Soft, non-tender, non-distended EXTREMITIES:  No edema; No deformity   ASSESSMENT AND PLAN ***    {Are you ordering a CV Procedure (e.g. stress test, cath, DCCV, TEE, etc)?   Press F2        :[id number]}  Dispo: ***  Signed, Rollo FABIENE Vicci, PA-C

## 2023-11-21 ENCOUNTER — Other Ambulatory Visit: Payer: Self-pay | Admitting: Gastroenterology

## 2023-11-21 MED ORDER — HEPATITIS A-HEP B RECOMB VAC 720-20 ELU-MCG/ML IM SUSY: 1.0000 mL | PREFILLED_SYRINGE | Freq: Once | INTRAMUSCULAR | 0 refills | Status: DC

## 2023-11-21 MED ORDER — HEPLISAV-B 20 MCG/0.5ML IM SOSY: 0.5000 mL | PREFILLED_SYRINGE | Freq: Once | INTRAMUSCULAR | 0 refills | Status: AC

## 2023-11-21 MED ORDER — HEPATITIS A VACCINE 1440 EL U/ML IM SUSY: 1.0000 mL | PREFILLED_SYRINGE | Freq: Once | INTRAMUSCULAR | 0 refills | Status: AC

## 2023-11-23 ENCOUNTER — Ambulatory Visit: Attending: Cardiovascular Disease | Admitting: Cardiology

## 2023-11-24 ENCOUNTER — Encounter: Payer: Self-pay | Admitting: Cardiology

## 2023-12-15 MED ORDER — HUMIRA (2 PEN) 40 MG/0.4ML ~~LOC~~ AJKT: 40.0000 mg | AUTO-INJECTOR | SUBCUTANEOUS | 3 refills | Status: DC

## 2023-12-15 MED ORDER — HUMIRA-CD/UC/HS STARTER 80 MG/0.8ML ~~LOC~~ AJKT: AUTO-INJECTOR | SUBCUTANEOUS | 0 refills | Status: DC

## 2023-12-15 NOTE — Telephone Encounter (Signed)
 I am sending Humira to [facility].  She will complete her second hepatitis A and B vaccination the end of this month.  Although it is ideal to wait to start treatment until after vaccination is complete, given she is close to completing her second vaccination we will go ahead and start the process for Humira and I will go ahead and order this.  We should start her on therapy as soon as possible.  She will receive Humira 160 mg on day 1 (or 80 mg on day 1 and day 2) and then day 15 she will receive 80 mg and then on day 29 and every 14 days after she will receive 40 mg.   Charmaine Melia, MSN, APRN, FNP-BC, AGACNP-BC [facility]

## 2024-01-13 NOTE — Telephone Encounter (Signed)
 I called [facility] and they do infusions. They would send a nurse if she had got the medication from them, but it is only for teaching. Up to 3 times. She said you can call around to nursing agency to see if they would send someone out to give her the injection.

## 2024-02-06 ENCOUNTER — Other Ambulatory Visit: Payer: Self-pay | Admitting: Gastroenterology

## 2024-02-06 MED ORDER — HUMIRA-CD/UC/HS STARTER 80 MG/0.8ML ~~LOC~~ AJKT: AUTO-INJECTOR | SUBCUTANEOUS | 0 refills | Status: DC

## 2024-02-06 MED ORDER — HUMIRA (2 PEN) 40 MG/0.4ML ~~LOC~~ AJKT: 40.0000 mg | AUTO-INJECTOR | SUBCUTANEOUS | 11 refills | Status: DC

## 2024-02-10 ENCOUNTER — Other Ambulatory Visit: Payer: Self-pay | Admitting: Gastroenterology

## 2024-02-10 MED ORDER — HUMIRA (2 PEN) 40 MG/0.4ML ~~LOC~~ AJKT: AUTO-INJECTOR | SUBCUTANEOUS | 0 refills | Status: DC

## 2024-02-10 MED ORDER — HUMIRA (2 PEN) 40 MG/0.4ML ~~LOC~~ AJKT: 40.0000 mg | AUTO-INJECTOR | SUBCUTANEOUS | 11 refills | Status: AC

## 2024-02-10 MED ORDER — HUMIRA (2 PEN) 40 MG/0.4ML ~~LOC~~ AJKT: AUTO-INJECTOR | SUBCUTANEOUS | 11 refills | Status: DC

## 2024-02-10 MED ORDER — HUMIRA-CD/UC/HS STARTER 80 MG/0.8ML ~~LOC~~ AJKT: AUTO-INJECTOR | SUBCUTANEOUS | 0 refills | Status: AC

## 2024-02-10 NOTE — Progress Notes (Signed)
 Received paperwork from [facility] requesting 2 different prescriptions for Humira  given the 3 pens starter pack is no longer an option as well as communication that the 80 mg pens are only available in a 2 pack.    To [facility] I sent prescription for Humira  of two 80 mg pens to take on day 1 followed by a second prescription for 40 mg pens for her to take 2 of these on day 15 and then to take one 40 mg pen on day 29 and then every 14 days thereafter.  Charmaine Melia, MSN, APRN, FNP-BC, AGACNP-BC [facility]
